# Patient Record
Sex: Female | Born: 1985
Health system: Southern US, Community
[De-identification: ages and names within clinical notes are randomized; demographics above are authoritative.]

## PROBLEM LIST (undated history)

## (undated) DIAGNOSIS — F329 Major depressive disorder, single episode, unspecified: Secondary | ICD-10-CM

## (undated) DIAGNOSIS — F419 Anxiety disorder, unspecified: Secondary | ICD-10-CM

## (undated) DIAGNOSIS — J189 Pneumonia, unspecified organism: Secondary | ICD-10-CM

## (undated) DIAGNOSIS — N6009 Solitary cyst of unspecified breast: Secondary | ICD-10-CM

## (undated) DIAGNOSIS — F32A Depression, unspecified: Secondary | ICD-10-CM

## (undated) HISTORY — DX: Anxiety disorder, unspecified: F41.9

## (undated) HISTORY — PX: HERNIA REPAIR: SHX51

## (undated) HISTORY — DX: Depression, unspecified: F32.A

## (undated) HISTORY — PX: BREAST LUMPECTOMY: SHX2

## (undated) HISTORY — PX: MASTECTOMY: SHX3

## (undated) HISTORY — DX: Major depressive disorder, single episode, unspecified: F32.9

---
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
## 2008-07-13 ENCOUNTER — Inpatient Hospital Stay (HOSPITAL_COMMUNITY): Admission: EM | Admit: 2008-07-13 | Discharge: 2008-07-16 | Payer: Self-pay | Admitting: Emergency Medicine

## 2008-07-13 ENCOUNTER — Ambulatory Visit: Payer: Self-pay | Admitting: Internal Medicine

## 2008-07-17 ENCOUNTER — Encounter: Payer: Self-pay | Admitting: Internal Medicine

## 2008-07-18 ENCOUNTER — Encounter (INDEPENDENT_AMBULATORY_CARE_PROVIDER_SITE_OTHER): Payer: Self-pay | Admitting: Internal Medicine

## 2009-05-11 ENCOUNTER — Ambulatory Visit: Payer: Self-pay | Admitting: Internal Medicine

## 2009-05-11 DIAGNOSIS — K5900 Constipation, unspecified: Secondary | ICD-10-CM | POA: Insufficient documentation

## 2009-05-14 LAB — CONVERTED CEMR LAB
BUN: 12 mg/dL (ref 6–23)
Creatinine, Ser: 0.8 mg/dL (ref 0.4–1.2)
GFR calc non Af Amer: 115.9 mL/min (ref >60)
TSH: 0.34 microintl units/mL — ABNORMAL LOW (ref 0.35–5.50)

## 2009-05-15 ENCOUNTER — Telehealth: Payer: Self-pay | Admitting: Internal Medicine

## 2009-06-20 ENCOUNTER — Telehealth (INDEPENDENT_AMBULATORY_CARE_PROVIDER_SITE_OTHER): Payer: Self-pay | Admitting: *Deleted

## 2009-06-25 ENCOUNTER — Ambulatory Visit: Payer: Self-pay | Admitting: Internal Medicine

## 2009-06-25 LAB — CONVERTED CEMR LAB
T3 Uptake Ratio: 39.3 % — ABNORMAL HIGH (ref 22.5–37.0)
T4, Total: 7.8 ug/dL (ref 5.0–12.5)

## 2009-06-26 ENCOUNTER — Ambulatory Visit: Payer: Self-pay | Admitting: Internal Medicine

## 2009-08-08 ENCOUNTER — Encounter (INDEPENDENT_AMBULATORY_CARE_PROVIDER_SITE_OTHER): Payer: Self-pay | Admitting: *Deleted

## 2010-05-01 ENCOUNTER — Telehealth: Payer: Self-pay | Admitting: Internal Medicine

## 2010-11-12 NOTE — Progress Notes (Signed)
Summary: Schedule Office Visit  Phone Note Outgoing Call Call back at [facility] Phone [phone]   Call placed by: Harlow Mares CMA Duncan Dull),  May 01, 2010 11:56 AM Call placed to: Patient Summary of Call: Left message on patients machine to call back. patient is due for an office visit. Initial call taken by: Harlow Mares CMA Duncan Dull),  May 01, 2010 11:57 AM  Follow-up for Phone Call        Left a message on the patient machine to call back and schedule a previsit and procedure with our office. A letter will be mailed to the patient.   Follow-up by: Harlow Mares CMA Duncan Dull),  May 09, 2010 9:55 AM

## 2011-02-28 NOTE — Discharge Summary (Signed)
[name], Gonzales               ACCOUNT NO.:  [id number]   MEDICAL RECORD NO.:  [id number]          PATIENT TYPE:  INP   LOCATION:  5524                         FACILITY:  [facility]   PHYSICIAN:  Manning Charity, MD     DATE OF BIRTH:  Nov 14, 1985   DATE OF ADMISSION:  07/13/2008  DATE OF DISCHARGE:  07/16/2008                               DISCHARGE SUMMARY   DISCHARGE DIAGNOSES:  1. Abdominal pain with nausea and vomiting, likely sec. to      constipation.  2. Gall bladded polyps (X2).  3. Right ovarian simple cyst.  4. Hypokalemia.  5. Hyperglycemia, HbA1C 5.8%.   DISCHARGE MEDICATIONS:  1. K-Dur 20 mEq 1 tablet p.o. daily x3 days.  2. MiraLax 17 g p.o. nightly p.r.n. constipation.  3. Simethicone 40 mg p.o. q.a.c. and nightly p.r.n. for flatus and      bloating.  4. Vicodin 5/500 one tablet p.o. q.4 h. p.r.n. pain.   DISPOSITION:  The patient was discharged home with instructions to  follow up at the Outpatient Clinic.  The patient was advised that a  clinic representative will call her to confirm an appointment time.  She  was also instructed to return to the emergency room if her severe pain  or if additional symptoms of nausea and vomiting returned if needed or  in the event she is unable to return to the clinic.   ADMITTING HISTORY AND PHYSICAL:  Erin Gonzales is a 25 year old African  American female with no significant past medical history.  She presented  to the [facility] ED complaining of 2 days of abdominal pain, nausea,  and vomiting x2.  She describes the pain that started in the upper area  of her abdomen that awoke her up from sleep, that became sharp at times  but was constantly cramping like gas pain.  She denied any radiation  of the pain and states that it started after eating a spicy dinner.  She  denied any alleviating factors and denied any aggravating factors;  however, she stated that it seemed to be worse after eating food.  She  denied any prior  symptoms.  Denied any recent NSAID or alcohol use or  recent travel or sick contacts.  She denied fever, chills, blood in her  stools, blood in her urine, painful urination, vaginal discharge,  unprotected sex, other vaginal symptoms, and use of an IUD.  She denied  use of any other medications except for the NuvaRing that she uses for  birth control.  She admitted to having 2 sexual partners recently,  however, was very adamant that she always uses condoms as well as other  methods of safe sex techniques in addition to her NuvaRing.  She denied  any prior history of STD.  Her last menstrual period was July 05, 2008.   PHYSICAL EXAMINATION:  GENERAL:  No apparent distress.  The patient was  talking on the phone while in the ER.  Alert and oriented x3.  HEAD AND NECK:  PERRLA, EOMI, sclerae were anicteric and conjunctivae  within normal limits.  Mucous  membranes were slightly dry; however, no  erythema was noted in the posterior oropharynx.  Neck was supple without  any palpable nodes or masses.  No carotid bruits were heard with  auscultation.  RESPIRATORY:  Lungs were clear to auscultation bilaterally with no  rhonchi, no wheezing, and no crackles.  CARDIOVASCULAR:  Clear S1, S2.  Regular rate and rhythm with no murmurs,  rubs, clicks, or gallops.  GASTROINTESTINAL:  Abdomen was soft.  Bowel sounds were present x4 and  hypoactive.  Diffuse tenderness was noted on palpation, however, was  clearest in the epigastric, right upper quadrant, and right lower  quadrant area with minimal guarding in the right upper and lower  quadrants.  No rebound.  Murphy sign negative.  Rovsing sign negative.  Psoas negative.  EXTREMITIES:  Within normal limits.  GU:  No CVA tenderness.  SKIN:  No jaundice, no rash visible, no lesions visible.  NEURO:  No focal deficit.   LABORATORY DATA:  Urine pregnancy test was negative.  Urinalysis showed  a cloudy urine with a pH of 8.5, glucose of 100, total  protein of 100,  nitrite negative, leukocyte esterase negative; however, many epithelial  cells were noticed in the sample along with minimal bacteria and a few  white blood cells.  The amount of epithelial cells indicated, but this  was not the ideal specimen for analysis.  Complete metabolic panel was  as follows:  Sodium 135, potassium 2.9, chloride 101, CO2 of 24, BUN 7,  creatinine 0.8, and glucose 162.  Anion gap 10, total bili 0.6, alk phos  60, AST 25, ALT 27, total protein 6.9, albumin 3.6, calcium 8.8.  CBC  was as follows:  White blood cells 10.1 with ANC of 7.2, hemoglobin 13,  hematocrit 39.4 with MCV of 81.8, platelets 345.  Acute abdominal series  was obtained showing no acute cardiopulmonary findings as well as  moderate stool throughout the colon with normal gas pattern suggestive  of constipation.  Abdominal ultrasound was also obtained indicating the  presence of 2 hyperechoic non-mobile polyps of 5.5 mm and 6 cm each.  There was no strong evidence for acute cholecystitis.   The patient was started on Zofran, Pepcid, Reglan, and given IV fluids  and IV potassium as well as medications for pain control and was  admitted to Internal Medicine Teaching Service onto the regular floor as  an inpatient.   HOSPITAL COURSE BY PROBLEMS:  1. Abdominal pain.  The patient complained of abdominal pain      particularly in the right upper and right lower quadrant.  Although      the abdominal ultrasound obtained in the ER indicated the presence      of gallbladder polyps, it was not felt that these polyps would be      the most likely cause of acute onset of abdominal pain with nausea      and vomiting; however, we will likely need continued followup with      repeat ultrasound on an outpatient basis.  The acute abdominal      series as well as the patient's complaints were highly suggestive      of abdominal pain secondary to constipation.  The patient was given      Senokot as  well as soap suds enema x2, both of which produced bowel      movements with decrease of abdominal pain per patient.  Despite      some mild improvement, she continued  to have some pain on July 14, 2008.  Transvaginal ultrasound was performed to rule out ovarian      torsion.  The procedure was within normal limits, however,      indicated the presence of a 2.3-cm simple cyst on the right ovary,      most likely representative of a dominant follicle.  This pretty      likely could have contributed to her lower quadrant pain, however,      would be expected to resolve with time.  Abdominal CT was performed      as well as the patient continued to complain of pain.  The CT      confirmed the presence of a right ovarian cyst as well as some      questionable gallbladder wall thickening; however, all labs were      within normal limits.  Repeat urinalysis and culture were negative.      Fecal occult blood were negative.  H. pylori was negative and urine      Chlamydia and gonorrhea screen was negative.  The patient was      reassured that her symptoms were most likely secondary to      constipation as well as possible ovarian cyst and that her symptoms      would resolve with time.  She was sent home with some MiraLax with      simethicone as well as Vicodin for pain control and relief of      constipation.  She will have a followup appointment at the Medicine      Outpatient Clinic at [facility] and was advised to return to the      emergency department if her symptoms returned or worsened.  2. Hypokalemia.  The patient's potassium on admission was 2.9.      Initially, she was repleted with IV potassium and subsequently      given oral potassium.  Her potassium levels stabilized, however,      remained at the lower limits of normal alternative course of her      hospital stay and on discharge, potassium is 3.3.  She was      discharged home with oral potassium to take over the course  of 3      days.  This will also be followed up as an outpatient and her visit      to the outpatient clinic.  3. Hyperglycemia.  The patient's glucose was elevated on admission at      a level of 162.  There was concern about potential diabetes      mellitus type 2 given strong family history and the patient      complaining of some polyuria and polydipsia.  HbA1c was ordered      with a result of 5.8.  We reassured the patient that she most      likely was not developing diabetes mellitus type 2 at this time,      and we felt that her hyperglycemia was transient secondary to her      acute illness.  Her blood glucose levels resolved and remained      within normal limits for the remainder of her hospital course.   VITAL SIGNS ON DISCHARGE:  Temperature 98.3, heart rate 87, respiratory  rate 20, blood pressure 80/55, and pulse ox of 99% on room air.   Basic metabolic panel was obtained with results as follows:  Sodium  139,  potassium 3.3, chloride 105, CO2 of 27, BUN 2, creatinine 0.66, and  glucose 84.  Magnesium was also obtained with a level of 1.8.   Hollace Hayward MS IV      Carlus Pavlov, M.D.  Electronically Signed      Manning Charity, MD  Electronically Signed    CG/MEDQ  D:  07/17/2008  T:  07/18/2008  Job:  [id number]

## 2011-07-15 LAB — BASIC METABOLIC PANEL
BUN: 2 — ABNORMAL LOW
BUN: 3 — ABNORMAL LOW
CO2: 24
Calcium: 8.5
Calcium: 8.8
Calcium: 9
Chloride: 105
Creatinine, Ser: 0.77
Creatinine, Ser: 0.79
Creatinine, Ser: 0.81
GFR calc Af Amer: >60
GFR calc Af Amer: >60
GFR calc non Af Amer: >60
GFR calc non Af Amer: >60
GFR calc non Af Amer: >60
Glucose, Bld: 105 — ABNORMAL HIGH
Glucose, Bld: 83
Glucose, Bld: 90
Potassium: 3.3 — ABNORMAL LOW
Sodium: 138
Sodium: 140

## 2011-07-15 LAB — URINALYSIS, ROUTINE W REFLEX MICROSCOPIC
Bilirubin Urine: NEGATIVE
Glucose, UA: NEGATIVE
Hgb urine dipstick: NEGATIVE
Ketones, ur: 15 — AB
Protein, ur: NEGATIVE
Specific Gravity, Urine: 1.02
Urobilinogen, UA: 1
pH: 5.5
pH: 8.5 — ABNORMAL HIGH

## 2011-07-15 LAB — DIFFERENTIAL
Lymphocytes Relative: 20
Monocytes Absolute: 0.8
Monocytes Relative: 8
Neutro Abs: 7.2

## 2011-07-15 LAB — COMPREHENSIVE METABOLIC PANEL
ALT: 27
AST: 25
Albumin: 2.8 — ABNORMAL LOW
Albumin: 3.6
BUN: 2 — ABNORMAL LOW
BUN: 7
CO2: 24
Calcium: 8.5
Calcium: 8.8
Chloride: 101
Creatinine, Ser: 0.77
Glucose, Bld: 162 — ABNORMAL HIGH
Glucose, Bld: 84
Total Bilirubin: 0.6
Total Protein: 6.2
Total Protein: 6.9

## 2011-07-15 LAB — GLUCOSE, CAPILLARY
Glucose-Capillary: 112 — ABNORMAL HIGH
Glucose-Capillary: 73
Glucose-Capillary: 74
Glucose-Capillary: 79
Glucose-Capillary: 80
Glucose-Capillary: 90

## 2011-07-15 LAB — CBC
HCT: 34.8 — ABNORMAL LOW
Hemoglobin: 11.8 — ABNORMAL LOW
Hemoglobin: 11.9 — ABNORMAL LOW
MCHC: 33.1
MCHC: 33.2
MCHC: 33.9
MCV: 80.2
Platelets: 252
Platelets: 294
Platelets: 345
RBC: 4.82
RDW: 14.1
RDW: 14.3

## 2011-07-15 LAB — URINE MICROSCOPIC-ADD ON

## 2011-07-15 LAB — LIPID PANEL
HDL: 51
LDL Cholesterol: 96
Triglycerides: 23
VLDL: 5

## 2011-07-15 LAB — MAGNESIUM: Magnesium: 1.8

## 2011-07-15 LAB — URINE CULTURE

## 2011-07-15 LAB — HELICOBACTER PYLORI ANTIGEN DET, STOOL: H pylori Ag, Stl: NEGATIVE

## 2011-07-15 LAB — HEMOGLOBIN A1C: Mean Plasma Glucose: 120

## 2012-01-30 ENCOUNTER — Ambulatory Visit (INDEPENDENT_AMBULATORY_CARE_PROVIDER_SITE_OTHER): Payer: BC Managed Care – HMO | Admitting: Obstetrics and Gynecology

## 2012-01-30 DIAGNOSIS — N912 Amenorrhea, unspecified: Secondary | ICD-10-CM

## 2012-01-30 LAB — POCT URINE PREGNANCY: Preg Test, Ur: NEGATIVE

## 2012-04-27 ENCOUNTER — Encounter: Payer: Self-pay | Admitting: Internal Medicine

## 2012-05-04 ENCOUNTER — Encounter: Payer: Self-pay | Admitting: Internal Medicine

## 2012-06-03 ENCOUNTER — Encounter: Payer: Self-pay | Admitting: Internal Medicine

## 2012-06-21 ENCOUNTER — Ambulatory Visit: Payer: Self-pay | Admitting: Internal Medicine

## 2013-01-06 ENCOUNTER — Other Ambulatory Visit: Payer: Self-pay | Admitting: Obstetrics and Gynecology

## 2013-01-07 LAB — PAP IG W/ RFLX HPV ASCU

## 2013-01-26 ENCOUNTER — Encounter: Payer: Self-pay | Admitting: Internal Medicine

## 2013-03-01 ENCOUNTER — Ambulatory Visit: Payer: Self-pay | Admitting: Internal Medicine

## 2014-11-01 ENCOUNTER — Ambulatory Visit (INDEPENDENT_AMBULATORY_CARE_PROVIDER_SITE_OTHER): Payer: BLUE CROSS/BLUE SHIELD | Admitting: Physician Assistant

## 2014-11-01 VITALS — BP 102/66 | HR 98 | Temp 98.3°F | Resp 16 | Ht 60.25 in | Wt 196.2 lb

## 2014-11-01 DIAGNOSIS — Z131 Encounter for screening for diabetes mellitus: Secondary | ICD-10-CM

## 2014-11-01 DIAGNOSIS — Z713 Dietary counseling and surveillance: Secondary | ICD-10-CM

## 2014-11-01 DIAGNOSIS — R635 Abnormal weight gain: Secondary | ICD-10-CM

## 2014-11-01 DIAGNOSIS — Z026 Encounter for examination for insurance purposes: Secondary | ICD-10-CM

## 2014-11-01 DIAGNOSIS — Z13220 Encounter for screening for lipoid disorders: Secondary | ICD-10-CM

## 2014-11-01 DIAGNOSIS — Z1329 Encounter for screening for other suspected endocrine disorder: Secondary | ICD-10-CM

## 2014-11-01 LAB — COMPREHENSIVE METABOLIC PANEL
ALT: 16 U/L (ref 0–35)
AST: 15 U/L (ref 0–37)
Albumin: 4.3 g/dL (ref 3.5–5.2)
Alkaline Phosphatase: 68 U/L (ref 39–117)
BUN: 9 mg/dL (ref 6–23)
CO2: 26 mEq/L (ref 19–32)
Calcium: 9.5 mg/dL (ref 8.4–10.5)
Chloride: 101 mEq/L (ref 96–112)
Creat: 0.83 mg/dL (ref 0.50–1.10)
Glucose, Bld: 92 mg/dL (ref 70–99)
Potassium: 3.7 mEq/L (ref 3.5–5.3)
Sodium: 137 mEq/L (ref 135–145)
Total Bilirubin: 0.4 mg/dL (ref 0.2–1.2)
Total Protein: 8.1 g/dL (ref 6.0–8.3)

## 2014-11-01 LAB — LIPID PANEL
Cholesterol: 172 mg/dL (ref 0–200)
HDL: 41 mg/dL (ref >39)
LDL CALC: 114 mg/dL — AB (ref 0–99)
Total CHOL/HDL Ratio: 4.2 Ratio
Triglycerides: 84 mg/dL (ref <150)
VLDL: 17 mg/dL (ref 0–40)

## 2014-11-01 NOTE — Patient Instructions (Addendum)
Thanks for coming in today.  We drew some different labs today and I'll be in contact with you with those results. I'd like to see you back in one month to discuss how things are going. In the meantime, please keep a record of all your calorie intake over the next 2 weeks. This will help to give you an idea of where you're at. I would then recommend trying to cut out about 200 calories per day. Coupling this with trying to burn about 200 calories per day through exercise is a great long term solution.  Please let us know if you need to have a complete physical here, we'd be happy to do it.

## 2014-11-01 NOTE — Progress Notes (Signed)
   Subjective:    Patient ID: Erin Gonzales, female    DOB: 04/04/1986, 29 y.o.   MMargart SicklesN: [id number]  PCP: Janine LimboSTRINGER,ARTHUR V, MD  Chief Complaint  Patient presents with  . Health Screening    Needs form completed for employer   . Weight Gain    Would like guidance on how to lose weight    Patient Active Problem List   Diagnosis Date Noted  . CONSTIPATION 05/11/2009   Prior to Admission medications   Medication Sig Start Date End Date Taking? Authorizing Provider  levonorgestrel (MIRENA) 20 MCG/24HR IUD 1 each by Intrauterine route once.   Yes Historical Provider, MD  Multiple Vitamin (MULTIVITAMIN) tablet Take 1 tablet by mouth daily.   Yes Historical Provider, MD   Medications, allergies, past medical history, surgical history, family history, social history and problem list reviewed and updated.  HPI  [age] yof presents needing form filled out for insurance through work.   Needs BMI, HR, BP documented along with lipids and glucose drawn.   She also is interested in weight loss counseling. She is currently 196# and BMI is 38. She states has always been 20# ish pounds overweight but over the past year has gained a lot more. States she has tried different diets with no luck.   Average meals: Bfast: Slimfast shakfe, apple, juice Lunch: 6" subway sandwich, chips, regular soda Dinner: Protein/vegetable/starch Snacks: None Drinks: Approx 32 oz juice day. Has been drinking lot more alcohol than normal for the past year. Drinks 3-x/week. Liquor usually mixed with juice/soda.   Denies depression/anxiety. Has been drinking more as she is dating an Tree surgeonartist and her social scene now involves more going out.   Exercise: None. Despises it.   Review of Systems No cp, palps, edema, sob, dysuria, hematuria, ha, vision changes.     Objective:   Physical Exam  Constitutional: She is oriented to person, place, and time.  Non-toxic appearance. She does not have a sickly appearance. She does not  appear ill. No distress.  BP 102/66 mmHg  Pulse 98  Temp(Src) 98.3 F (36.8 C) (Oral)  Resp 16  Ht 5' 0.25" (1.53 m)  Wt 196 lb 3.2 oz (88.996 kg)  BMI 38.02 kg/m2  SpO2 100%  LMP 10/28/2014   Neurological: She is alert and oriented to person, place, and time.  Psychiatric: She has a normal mood and affect. Her speech is normal. She expresses no suicidal plans.      Assessment & Plan:   [age] yof presents needing form filled out for insurance through work.   Screening for diabetes mellitus - Plan: Comprehensive metabolic panel Screening for thyroid disorder - Plan: TSH Weight gain Screening for hypercholesterolemia - Plan: Lipid panel  Dietary counseling --Long discussion on weight, diet options, etc --Pt to keep records of caloric intake, rtc to see me in one month for further discussion --Gave my schedule for end of feb, pt to pick a time and see me back  Donnajean Lopesodd M. Denis Koppel, PA-C Physician Assistant-Certified [facility]  11/01/2014 11:59 AM  Greater than 45 minutes spent with patient, greater than 50% of that time spent discussing diet and exercise.

## 2014-11-02 LAB — TSH: TSH: 1.317 u[IU]/mL (ref 0.350–4.500)

## 2015-01-25 ENCOUNTER — Telehealth: Payer: Self-pay | Admitting: Physician Assistant

## 2015-01-25 NOTE — Telephone Encounter (Signed)
Patient dropped off a physician results form to be completed for insurance purposes. Patient saw Deliah BostonMichael Clark in January 2016. Patient states the deadline is 01/29/2015 (Monday). Can this possibly be completed today? Form has been placed in [employer] Clark's box for review and signature. Clinical, please call patient when this is ready for pick up.  [phone]

## 2015-01-26 NOTE — Telephone Encounter (Signed)
Hi Tamara,  I have never seen this patient.  I looked back in epic and it looks like she saw Tawanna Coolerodd in January.  I will forward this message to him. I am happy to sign if he needs me to, but I will be out of the office for today and the next three days. Deliah BostonMichael Bralen Wiltgen, MS, PA-C   2:33 PM, 01/26/2015

## 2015-01-26 NOTE — Telephone Encounter (Signed)
Do not see form in my or Coralyn HellingMichael Clarks box at this time.

## 2015-01-27 NOTE — Telephone Encounter (Signed)
No I never saw it. I'll be back in the clinic on Monday morning.

## 2015-01-27 NOTE — Telephone Encounter (Signed)
Todd, have you seen this form?

## 2015-01-30 NOTE — Telephone Encounter (Signed)
Form located in Michael's box. Completed form, signed by Tawanna Coolerodd, faxed to employer and copied for patient pickup and scanning. Pt notified.

## 2015-01-30 NOTE — Telephone Encounter (Signed)
Called pt to see if she can fax me another form to fill out.

## 2015-08-13 ENCOUNTER — Ambulatory Visit (INDEPENDENT_AMBULATORY_CARE_PROVIDER_SITE_OTHER): Payer: BLUE CROSS/BLUE SHIELD | Admitting: Physician Assistant

## 2015-08-13 VITALS — BP 102/72 | HR 81 | Temp 98.2°F | Resp 16 | Ht 60.0 in | Wt 193.0 lb

## 2015-08-13 DIAGNOSIS — N949 Unspecified condition associated with female genital organs and menstrual cycle: Secondary | ICD-10-CM | POA: Diagnosis not present

## 2015-08-13 DIAGNOSIS — Z113 Encounter for screening for infections with a predominantly sexual mode of transmission: Secondary | ICD-10-CM | POA: Diagnosis not present

## 2015-08-13 LAB — HIV ANTIBODY (ROUTINE TESTING W REFLEX): HIV: NONREACTIVE

## 2015-08-13 LAB — RPR

## 2015-08-13 NOTE — Patient Instructions (Signed)
I will contact you with the results of your lab work as soon as possible.   You can use the warm compresses at least 3-4 times per day.  Do not make the warmth too hot. You can also take warm sitz baths, but these are asymptomatic.

## 2015-08-13 NOTE — Progress Notes (Signed)
[facility] [address], [place] [address] [phone]  Date:  08/13/2015   Name:  Erin Gonzales   DOB:  [birth year]   MRN:  [id number]  PCP:  Janine Limbo, MD    History of Present Illness:  Erin Gonzales is a 29 y.o. female patient who presents to [facility] for chief complaint of bumps she found at her right side of vagina yesterday.  She states that they are non-tender, and pimple like.  No pruritus, redness, or erythema.  She has a hx of boils.  She has no change in vaginal discharge, or urinary changes.  She is sexually active unprotected.  Recently came from a trip days ago, where she was swimming at the beach.  No hot baths or jacuzzi.         Patient Active Problem List   Diagnosis Date Noted  . CONSTIPATION 05/11/2009    Past Medical History  Diagnosis Date  . Anxiety   . Depression     History reviewed. No pertinent past surgical history.  Social History  Substance Use Topics  . Smoking status: Never Smoker   . Smokeless tobacco: None  . Alcohol Use: 3.0 - 3.6 oz/week    5-6 Standard drinks or equivalent per week    Family History  Problem Relation Age of Onset  . Hyperlipidemia Mother   . Diabetes Father   . Cancer Maternal Grandmother   . Cancer Paternal Grandmother   . Cancer Paternal Grandfather     Allergies  Allergen Reactions  . Peanut-Containing Drug Products     Medication list has been reviewed and updated.  Current Outpatient Prescriptions on File Prior to Visit  Medication Sig Dispense Refill  . levonorgestrel (MIRENA) 20 MCG/24HR IUD 1 each by Intrauterine route once.    . Multiple Vitamin (MULTIVITAMIN) tablet Take 1 tablet by mouth daily.     No current facility-administered medications on file prior to visit.    ROS ROS otherwise unremarkable unless listed above.   Physical Examination: BP 102/72 mmHg  Pulse 81  Temp(Src) 98.2 F (36.8 C) (Oral)  Resp 16  Ht 5' (1.524 m)  Wt 193 lb (87.544 kg)   BMI 37.69 kg/m2  SpO2 99%  LMP 08/08/2015 Ideal Body Weight: Weight in (lb) to have BMI = 25: 127.7  Physical Exam  Constitutional: She is oriented to person, place, and time. She appears well-developed and well-nourished. No distress.  HENT:  Head: Normocephalic and atraumatic.  Right Ear: External ear normal.  Left Ear: External ear normal.  Eyes: Conjunctivae and EOM are normal. Pupils are equal, round, and reactive to light.  Cardiovascular: Normal rate.   Pulmonary/Chest: Effort normal. No respiratory distress.  Genitourinary: Vagina normal.    Pelvic exam was performed with patient supine. Cervix exhibits no motion tenderness, no discharge and no friability. No vaginal discharge found.  Small non-erythematous cyst like lesions at the right labia minora.  Non-tender.  No drainage.    Neurological: She is alert and oriented to person, place, and time.  Skin: She is not diaphoretic.  Psychiatric: She has a normal mood and affect. Her behavior is normal.     Assessment and Plan: Erin Gonzales is a 29 y.o. female who is here today for lesions of the right lesion.  Advised warm compresses.  Possible folliculitis.  These are not herpetic, or syphilis-like lesions, but will test following sexual history. Advised warm compresses and monitoring. Screening for STD (sexually transmitted disease) -  Plan: HSV(herpes simplex vrs) 1+2 ab-IgG, HIV antibody, GC/Chlamydia Probe Amp, RPR  Genital lesion, female - Plan: HSV(herpes simplex vrs) 1+2 ab-IgG, RPR    Trena PlattStephanie Renna Kilmer, PA-C [facility] 08/13/2015 8:55 AM

## 2015-08-14 LAB — GC/CHLAMYDIA PROBE AMP
CT Probe RNA: NEGATIVE
GC Probe RNA: NEGATIVE

## 2015-08-14 LAB — HSV(HERPES SIMPLEX VRS) I + II AB-IGG: HSV 2 GLYCOPROTEIN G AB, IGG: 0.22 IV

## 2015-10-25 ENCOUNTER — Ambulatory Visit (INDEPENDENT_AMBULATORY_CARE_PROVIDER_SITE_OTHER): Payer: 59 | Admitting: Physician Assistant

## 2015-10-25 VITALS — BP 107/72 | HR 93 | Temp 98.1°F | Resp 18 | Ht 59.0 in | Wt 190.0 lb

## 2015-10-25 DIAGNOSIS — K148 Other diseases of tongue: Secondary | ICD-10-CM

## 2015-10-25 DIAGNOSIS — B370 Candidal stomatitis: Secondary | ICD-10-CM | POA: Diagnosis not present

## 2015-10-25 LAB — POCT GLYCOSYLATED HEMOGLOBIN (HGB A1C): Hemoglobin A1C: 5.5

## 2015-10-25 LAB — POCT SKIN KOH: Skin KOH, POC: POSITIVE

## 2015-10-25 MED ORDER — FLUCONAZOLE 100 MG PO TABS: 100.0000 mg | ORAL_TABLET | Freq: Every day | ORAL | Status: DC

## 2015-10-25 NOTE — Progress Notes (Signed)
10/25/2015 9:21 PM   DOB: 1986-04-04 / MRN: [id number]  SUBJECTIVE:  Erin Gonzales is a 30 y.o. female presenting for for the evaluation of sore throat that started 1 days ago.  Associated symptoms include cough today and she denies runny nose, congestion, fever, headache and jaw pain. Treatments tried thus far include OTC preparations with poor relief. She reports sick contacts. She has been pushing PO fluids.     She is allergic to peanut-containing drug products.   She  has a past medical history of Anxiety and Depression.    She  reports that she has never smoked. She does not have any smokeless tobacco history on file. She reports that she drinks about 3.0 - 3.6 oz of alcohol per week. She reports that she does not use illicit drugs. She  has no sexual activity history on file. The patient  has no past surgical history on file.  Her family history includes Cancer in her maternal grandmother, paternal grandfather, and paternal grandmother; Diabetes in her father; Hyperlipidemia in her mother.  Review of Systems  Constitutional: Negative for fever and chills.  HENT: Negative for congestion and ear pain.   Eyes: Negative for blurred vision.  Respiratory: Positive for cough. Negative for hemoptysis and shortness of breath.   Cardiovascular: Negative for chest pain.  Gastrointestinal: Negative for nausea and abdominal pain.  Genitourinary: Negative for dysuria, urgency and frequency.  Musculoskeletal: Negative for myalgias.  Skin: Negative for rash.  Neurological: Negative for dizziness, tingling and headaches.  Psychiatric/Behavioral: Negative for depression. The patient is not nervous/anxious.     Problem list and medications reviewed and updated by myself where necessary, and exist elsewhere in the encounter.   OBJECTIVE:  BP 107/72 mmHg  Pulse 93  Temp(Src) 98.1 F (36.7 C) (Oral)  Resp 18  Ht 4\' 11"  (1.499 m)  Wt 190 lb (86.183 kg)  BMI 38.35 kg/m2  SpO2 99%  Physical  Exam  Constitutional: She is oriented to person, place, and time. She appears well-developed.  HENT:  Mouth/Throat:    Eyes: EOM are normal. Pupils are equal, round, and reactive to light.  Cardiovascular: Normal rate.   Pulmonary/Chest: Effort normal.  Abdominal: She exhibits no distension.  Musculoskeletal: Normal range of motion.  Neurological: She is alert and oriented to person, place, and time. No cranial nerve deficit.  Skin: Skin is warm and dry. She is not diaphoretic.  Psychiatric: She has a normal mood and affect.  Vitals reviewed.   Results for orders placed or performed in visit on 10/25/15 (from the past 48 hour(s))  POCT Skin KOH     Status: None   Collection Time: 10/25/15  8:59 PM  Result Value Ref Range   Skin KOH, POC Positive   POCT glycosylated hemoglobin (Hb A1C)     Status: None   Collection Time: 10/25/15  9:10 PM  Result Value Ref Range   Hemoglobin A1C 5.5     ASSESSMENT AND PLAN  Erin Gonzales was seen today for sore throat, other and other.  Diagnoses and all orders for this visit:  Tongue lesion -     POCT Skin KOH -     POCT glycosylated hemoglobin (Hb A1C)  Oral yeast infection -     POCT glycosylated hemoglobin (Hb A1C)  Other orders -     fluconazole (DIFLUCAN) 100 MG tablet; Take 1 tablet (100 mg total) by mouth daily. Take two on day one and one daily thereafter for two weeks.  The patient was advised to call or return to clinic if she does not see an improvement in symptoms or to seek the care of the closest emergency department if she worsens with the above plan.   Deliah BostonMichael Clark, MHS, PA-C [facility] 10/25/2015 9:21 PM

## 2015-10-31 NOTE — Progress Notes (Signed)
  Medical screening examination/treatment/procedure(s) were performed by non-physician practitioner and as supervising physician I was immediately available for consultation/collaboration.     

## 2015-10-31 NOTE — Addendum Note (Signed)
Addended by: Carmelina Dane on: 10/31/2015 07:49 PM   Modules accepted: Kipp Brood

## 2016-02-20 ENCOUNTER — Ambulatory Visit (HOSPITAL_COMMUNITY)
Admission: EM | Admit: 2016-02-20 | Discharge: 2016-02-20 | Disposition: A | Discharge status: Home / Self Care | Payer: 59 | Attending: Family Medicine | Admitting: Family Medicine

## 2016-02-20 ENCOUNTER — Encounter (HOSPITAL_COMMUNITY): Payer: Self-pay | Admitting: Emergency Medicine

## 2016-02-20 DIAGNOSIS — J189 Pneumonia, unspecified organism: Secondary | ICD-10-CM

## 2016-02-20 MED ORDER — CEFTRIAXONE SODIUM 1 G IJ SOLR
1.0000 g | Freq: Once | INTRAMUSCULAR | Status: AC
  Administered 2016-02-20: 1 g via INTRAMUSCULAR

## 2016-02-20 MED ORDER — ALBUTEROL SULFATE HFA 108 (90 BASE) MCG/ACT IN AERS: 2.0000 | INHALATION_SPRAY | RESPIRATORY_TRACT | Status: DC | PRN

## 2016-02-20 MED ORDER — CEFTRIAXONE SODIUM 1 G IJ SOLR
INTRAMUSCULAR | Status: AC
Start: 2016-02-20 — End: 2016-02-20
  Filled 2016-02-20: qty 10

## 2016-02-20 MED ORDER — LIDOCAINE HCL (PF) 1 % IJ SOLN
INTRAMUSCULAR | Status: AC
Start: 2016-02-20 — End: 2016-02-20
  Filled 2016-02-20: qty 5

## 2016-02-20 MED ORDER — AZITHROMYCIN 250 MG PO TABS: 250.0000 mg | ORAL_TABLET | Freq: Every day | ORAL | Status: DC

## 2016-02-20 MED ORDER — BENZONATATE 100 MG PO CAPS: 100.0000 mg | ORAL_CAPSULE | Freq: Three times a day (TID) | ORAL | Status: DC | PRN

## 2016-02-20 NOTE — ED Provider Notes (Signed)
CSN: [id number]     Arrival date & time 02/20/16  1842 History   First MD Initiated Contact with Patient 02/20/16 1925     Chief Complaint  Patient presents with  . Generalized Body Aches  . Cough   (Consider location/radiation/quality/duration/timing/severity/associated sxs/prior Treatment) Patient is a 30 y.o. female presenting with cough. The history is provided by the patient. No language interpreter was used.  Cough Associated symptoms: chills and diaphoresis   Associated symptoms: no chest pain, no ear pain, no shortness of breath, no sore throat and no wheezing   Patient presents with complaint of worsening cough, body aches and chills which began on Monday May 8th and have worsened since then. Slept most of the day on 05/08, developed cough that has been so severe she has had post tussive emesis (yesterday).  Has had chills and sweats but has not checked her temperature. No shortness of breath.  No nausea, no abd pain.   She is a nonsmoker. Works as Armed forces training and education officer at [employer]  Several students who have been sick contacts.   NKDA.   Past Medical History  Diagnosis Date  . Anxiety   . Depression    History reviewed. No pertinent past surgical history. Family History  Problem Relation Age of Onset  . Hyperlipidemia Mother   . Diabetes Father   . Cancer Maternal Grandmother   . Cancer Paternal Grandmother   . Cancer Paternal Grandfather    Social History  Substance Use Topics  . Smoking status: Never Smoker   . Smokeless tobacco: None  . Alcohol Use: 3.0 - 3.6 oz/week    5-6 Standard drinks or equivalent per week   OB History    No data available     Review of Systems  Constitutional: Positive for chills, diaphoresis and fatigue. Negative for appetite change.  HENT: Negative for ear pain and sore throat.   Respiratory: Positive for cough. Negative for shortness of breath and wheezing.   Cardiovascular: Negative for chest pain.   Endocrine:       LMP on menses now. Has Mirena IUD in place.     Allergies  Peanut-containing drug products  Home Medications   Prior to Admission medications   Medication Sig Start Date End Date Taking? Authorizing Provider  azithromycin (ZITHROMAX) 250 MG tablet Take 1 tablet (250 mg total) by mouth daily. Take first 2 tablets together, then 1 every day until finished. 02/20/16   Barbaraann Barthel, MD  benzonatate (TESSALON) 100 MG capsule Take 1 capsule (100 mg total) by mouth 3 (three) times daily as needed for cough. 02/20/16   Barbaraann Barthel, MD  fluconazole (DIFLUCAN) 100 MG tablet Take 1 tablet (100 mg total) by mouth daily. Take two on day one and one daily thereafter for two weeks. 10/25/15   Ofilia Neas, PA-C  levonorgestrel (MIRENA) 20 MCG/24HR IUD 1 each by Intrauterine route once.    Historical Provider, MD   Meds Ordered and Administered this Visit   Medications  cefTRIAXone (ROCEPHIN) injection 1 g (not administered)    BP 96/68 mmHg  Pulse 111  Temp(Src) 98.6 F (37 C) (Oral)  Resp 12  SpO2 100%  LMP 02/20/2016 (Exact Date) No data found.   Physical Exam  Constitutional: She appears well-developed and well-nourished.  Mild distress, no increased work of breathing.  Speaking in fluid sentences .  HENT:  Head: Normocephalic and atraumatic.  Right Ear: External ear normal.  Left Ear: External ear normal.  Nose: Nose normal.  Mouth/Throat: Oropharynx is clear and moist. No oropharyngeal exudate.  Eyes: Conjunctivae and EOM are normal. Pupils are equal, round, and reactive to light. Right eye exhibits no discharge. Left eye exhibits no discharge.  Neck: Normal range of motion. Neck supple.  Shotty anterior cervical adenopathy  Cardiovascular: Normal rate, regular rhythm and normal heart sounds.   Pulmonary/Chest: Effort normal. No respiratory distress. She has no wheezes. She has rales. She exhibits no tenderness.  Fine rales heard at R base. No wheezes or  rhonchi  Abdominal: Soft. There is no tenderness.  Lymphadenopathy:    She has cervical adenopathy.  Skin: She is not diaphoretic.    ED Course  Procedures (including critical care time)  Labs Review Labs Reviewed - No data to display  Imaging Review No results found.   Visual Acuity Review  Right Eye Distance:   Left Eye Distance:   Bilateral Distance:    Right Eye Near:   Left Eye Near:    Bilateral Near:         MDM   1. Community acquired pneumonia    Patient with chills, cough, diaphoresis and fine rales R base on lung exam. Presumptive treatment for CAP.  Ceftriaxone 1g IM x1, azithro, plans to follow up as needed if not improving.   Paula ComptonJames Laqueshia Cihlar, MD    Barbaraann BarthelJames O Mahlon Gabrielle, MD 02/20/16 [id number]

## 2016-02-20 NOTE — ED Notes (Addendum)
[facility] with a complaint of a cough and general body aches x 3 days. The patient stated that she has tried OTC meds with minimal relief.

## 2016-02-20 NOTE — Discharge Instructions (Signed)
It is a pleasure to see you today.  You are being treated for a presumptive diagnosis of early pneumonia.   You received an injection of an antibiotic CEFTRIAXONE 1 gram intramuscularly.   You are brescribed AZITHROMYCIN 250mg  tablets, take 2 tablets by mouth today, then 1 tablet daily for 4 more days.   Tessalon perles by mouth every 8 hours as needed for cough.   Note for work; may return tomorrow if feeling much better.   Return to Urgent Care Center if worsening, or if not improving.

## 2016-02-23 ENCOUNTER — Ambulatory Visit (INDEPENDENT_AMBULATORY_CARE_PROVIDER_SITE_OTHER): Payer: 59

## 2016-02-23 ENCOUNTER — Ambulatory Visit (INDEPENDENT_AMBULATORY_CARE_PROVIDER_SITE_OTHER): Payer: 59 | Admitting: Internal Medicine

## 2016-02-23 VITALS — BP 108/70 | HR 108 | Temp 98.5°F | Resp 17 | Ht 59.0 in | Wt 186.0 lb

## 2016-02-23 DIAGNOSIS — J22 Unspecified acute lower respiratory infection: Secondary | ICD-10-CM

## 2016-02-23 DIAGNOSIS — J988 Other specified respiratory disorders: Secondary | ICD-10-CM

## 2016-02-23 DIAGNOSIS — R05 Cough: Secondary | ICD-10-CM

## 2016-02-23 DIAGNOSIS — R059 Cough, unspecified: Secondary | ICD-10-CM

## 2016-02-23 MED ORDER — PREDNISONE 20 MG PO TABS: ORAL_TABLET | ORAL | Status: DC

## 2016-02-23 MED ORDER — HYDROCODONE-HOMATROPINE 5-1.5 MG/5ML PO SYRP: 5.0000 mL | ORAL_SOLUTION | Freq: Four times a day (QID) | ORAL | Status: DC | PRN

## 2016-02-23 NOTE — Patient Instructions (Signed)
     IF you received an x-ray today, you will receive an invoice from [facility]. Please contact [facility] at [phone] with questions or concerns regarding your invoice.   IF you received labwork today, you will receive an invoice from [facility]. Please contact [facility] at [phone] with questions or concerns regarding your invoice.   Our billing staff will not be able to assist you with questions regarding bills from these companies.  You will be contacted with the lab results as soon as they are available. The fastest way to get your results is to activate your My Chart account. Instructions are located on the last page of this paperwork. If you have not heard from us regarding the results in 2 weeks, please contact this office.      

## 2016-02-23 NOTE — Progress Notes (Addendum)
By signing my name below I, [name], attest that this documentation has been prepared under the direction and in the presence of Ellamae Siaobert Effa Yarrow, MD. Electonically Signed. [name], Scribe 02/23/2016 at 11:28 AM   Subjective:    Patient ID: Erin Gonzales, female    DOB: 08/08/1986, 30 y.o.   MRN: [id number]  Chief Complaint  Patient presents with  . patient states she has pneumonia  . Cough  . chest pressure    HPI Erin SicklesSacha Lobb is a 30 y.o. female who presents to the [facility] complaining of cough for the past 6 days. Pt states she was seen and evaluated at another [facility] 3 days ago and was told she has pneumonia and was prescribed zithromax. Pt states her chills improved and her cough has persisted. Pt denies sore throat. Pt also reports general body aches, CP with cough and chest pressure. Pt states she did not have an xray taken at the urgent care.  Pt denies history of asthma.   Patient Active Problem List   Diagnosis Date Noted  . CONSTIPATION 05/11/2009    Current outpatient prescriptions:  .  albuterol (PROVENTIL HFA;VENTOLIN HFA) 108 (90 Base) MCG/ACT inhaler, Inhale 2 puffs into the lungs every 4 (four) hours as needed for wheezing or shortness of breath., Disp: 1 Inhaler, Rfl: 0 .  azithromycin (ZITHROMAX) 250 MG tablet, Take 1 tablet (250 mg total) by mouth daily. Take first 2 tablets together, then 1 every day until finished., Disp: 6 tablet, Rfl: 0 .  benzonatate (TESSALON) 100 MG capsule, Take 1 capsule (100 mg total) by mouth 3 (three) times daily as needed for cough., Disp: 21 capsule, Rfl: 0 .  fluconazole (DIFLUCAN) 100 MG tablet, Take 1 tablet (100 mg total) by mouth daily. Take two on day one and one daily thereafter for two weeks., Disp: 15 tablet, Rfl: 0 .  levonorgestrel (MIRENA) 20 MCG/24HR IUD, 1 each by Intrauterine route once., Disp: , Rfl:   Allergies  Allergen Reactions  . Peanut-Containing Drug Products        Review of Systems  Constitutional: Negative for fever.  Respiratory: Positive for cough.   Cardiovascular: Positive for chest pain.  Musculoskeletal: Positive for myalgias.       Objective:   Physical Exam  Constitutional: She is oriented to person, place, and time. She appears well-developed and well-nourished. No distress.  HENT:  Head: Normocephalic and atraumatic.  Eyes: Conjunctivae are normal. Pupils are equal, round, and reactive to light.  Neck: Neck supple.  Cardiovascular: Regular rhythm and normal heart sounds.  Tachycardia present.  Exam reveals no gallop.   No murmur heard. Pulmonary/Chest: Effort normal. She has decreased breath sounds (posteriorly). She has rhonchi (anteriorly, rt).  Musculoskeletal: Normal range of motion.  Neurological: She is alert and oriented to person, place, and time.  Skin: Skin is warm and dry.  Psychiatric: She has a normal mood and affect. Her behavior is normal.  Nursing note and vitals reviewed.   Filed Vitals:   02/23/16 0927  BP: 108/70  Pulse: 108  Temp: 98.5 F (36.9 C)  TempSrc: Oral  Resp: 17  Height: 4\' 11"  (1.499 m)  Weight: 186 lb (84.369 kg)  SpO2: 100%   Dg Chest 2 View  02/23/2016  CLINICAL DATA:  Cough and chills EXAM: CHEST  2 VIEW COMPARISON:  None. FINDINGS: Cardiomediastinal silhouette is stable. No pulmonary edema. Subtle streaky right upper lobe and left base retrocardiac atelectasis or early infiltrate.  Bony thorax is unremarkable. IMPRESSION: No pulmonary edema. Subtle streaky right upper and left base retrocardiac lobe atelectasis or early infiltrate. Electronically Signed   By: Natasha Mead M.D.   On: 02/23/2016 11:23         Assessment & Plan:  Cough - Plan: DG Chest 2 View due to part rx [facility] w/ RAD  Finish meds Meds ordered this encounter  Medications  . HYDROcodone-homatropine (HYCODAN) 5-1.5 MG/5ML syrup    Sig: Take 5 mLs by mouth every 6 (six) hours as needed.    Dispense:  120 mL     Refill:  0  . predniSONE (DELTASONE) 20 MG tablet    Sig: 3/3/2/2/1/1 single daily dose for 6 days    Dispense:  12 tablet    Refill:  0     I have completed the patient encounter in its entirety as documented by the scribe, with editing by me where necessary. Merrin Mcvicker P. Merla Riches, M.D.

## 2016-05-05 ENCOUNTER — Other Ambulatory Visit: Payer: Self-pay | Admitting: Family

## 2016-05-05 DIAGNOSIS — N6320 Unspecified lump in the left breast, unspecified quadrant: Secondary | ICD-10-CM

## 2016-05-05 DIAGNOSIS — N6325 Unspecified lump in the left breast, overlapping quadrants: Secondary | ICD-10-CM

## 2016-05-07 ENCOUNTER — Other Ambulatory Visit: Payer: Self-pay | Admitting: Family

## 2016-05-07 ENCOUNTER — Ambulatory Visit
Admission: RE | Admit: 2016-05-07 | Discharge: 2016-05-07 | Disposition: A | Discharge status: Home / Self Care | Payer: 59 | Source: Ambulatory Visit | Attending: Family | Admitting: Family

## 2016-05-07 DIAGNOSIS — N6325 Unspecified lump in the left breast, overlapping quadrants: Secondary | ICD-10-CM

## 2016-05-07 DIAGNOSIS — N6320 Unspecified lump in the left breast, unspecified quadrant: Secondary | ICD-10-CM

## 2016-05-08 ENCOUNTER — Other Ambulatory Visit: Payer: Self-pay | Admitting: Family

## 2016-05-08 DIAGNOSIS — N63 Unspecified lump in unspecified breast: Secondary | ICD-10-CM

## 2016-05-14 ENCOUNTER — Other Ambulatory Visit: Payer: Self-pay

## 2016-05-14 ENCOUNTER — Other Ambulatory Visit: Payer: Self-pay | Admitting: Family

## 2016-05-14 DIAGNOSIS — N63 Unspecified lump in unspecified breast: Secondary | ICD-10-CM

## 2016-05-15 ENCOUNTER — Ambulatory Visit
Admission: RE | Admit: 2016-05-15 | Discharge: 2016-05-15 | Disposition: A | Discharge status: Home / Self Care | Payer: 59 | Source: Ambulatory Visit | Attending: Family | Admitting: Family

## 2016-05-15 DIAGNOSIS — N63 Unspecified lump in unspecified breast: Secondary | ICD-10-CM

## 2016-05-29 ENCOUNTER — Other Ambulatory Visit: Payer: Self-pay | Admitting: Surgery

## 2016-06-10 ENCOUNTER — Encounter (HOSPITAL_COMMUNITY)
Admission: RE | Admit: 2016-06-10 | Discharge: 2016-06-10 | Disposition: A | Discharge status: Home / Self Care | Payer: 59 | Source: Ambulatory Visit | Attending: Surgery | Admitting: Surgery

## 2016-06-10 ENCOUNTER — Encounter (HOSPITAL_COMMUNITY): Payer: Self-pay

## 2016-06-10 ENCOUNTER — Ambulatory Visit (HOSPITAL_COMMUNITY)
Admission: RE | Admit: 2016-06-10 | Discharge: 2016-06-10 | Disposition: A | Discharge status: Home / Self Care | Payer: 59 | Source: Ambulatory Visit | Attending: Anesthesiology | Admitting: Anesthesiology

## 2016-06-10 ENCOUNTER — Encounter (INDEPENDENT_AMBULATORY_CARE_PROVIDER_SITE_OTHER): Payer: Self-pay

## 2016-06-10 DIAGNOSIS — Z01818 Encounter for other preprocedural examination: Secondary | ICD-10-CM | POA: Diagnosis not present

## 2016-06-10 DIAGNOSIS — Z3202 Encounter for pregnancy test, result negative: Secondary | ICD-10-CM | POA: Insufficient documentation

## 2016-06-10 HISTORY — DX: Pneumonia, unspecified organism: J18.9

## 2016-06-10 LAB — HCG, SERUM, QUALITATIVE: PREG SERUM: NEGATIVE

## 2016-06-10 LAB — CBC
HEMATOCRIT: 36.5 % (ref 36.0–46.0)
Hemoglobin: 11.6 g/dL — ABNORMAL LOW (ref 12.0–15.0)
MCH: 24.2 pg — ABNORMAL LOW (ref 26.0–34.0)
MCHC: 31.8 g/dL (ref 30.0–36.0)
MCV: 76.2 fL — ABNORMAL LOW (ref 78.0–100.0)
Platelets: 383 10*3/uL (ref 150–400)
RBC: 4.79 MIL/uL (ref 3.87–5.11)
RDW: 18.1 % — AB (ref 11.5–15.5)
WBC: 4.1 10*3/uL (ref 4.0–10.5)

## 2016-06-10 NOTE — H&P (Signed)
Erin Gonzales 05/29/2016 2:02 PM Location: [facility] Patient #: [id number] DOB: May 29, 1986 Single / Language: [place] / Race: Black or African American Female   History of Present Illness (Erin Gonzales A. Magnus Ivan MD; 05/29/2016 2:25 PM) The patient is a 30 year old female who presents with a breast abscess. This is a pleasant female referred to Dr. 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 She reports that the masses in the left breast appeared approximate 3 months ago. She has had drainage from her nipple which been both clear and bloody as well as purulent. She underwent ultrasound and mammogram. This showed multiple dilated ducts and 2 masses. Biopsies were performed and the final results showed chronic inflammation of both areas consistent with chronic infection. These areas have continued to persist and she now has purulent drainage from one of the biopsy sites. She is otherwise healthy without complaints. There is no family history of breast cancer.   Other Problems Fay Records, CMA; 05/29/2016 2:02 PM) Anxiety Disorder Depression Lump In Breast Umbilical Hernia Repair  Past Surgical History Fay Records, CMA; 05/29/2016 2:02 PM) Breast Biopsy Left.  Diagnostic Studies History Fay Records, [place]; 05/29/2016 2:02 PM) Colonoscopy never Mammogram never Pap Smear 1-5 years ago  Allergies Fay Records, CMA; 05/29/2016 2:03 PM) No Known Drug Allergies08/17/2017  Medication History Fay Records, CMA; 05/29/2016 2:03 PM) No Current Medications Medications Reconciled  Social History Fay Records, CMA; 05/29/2016 2:02 PM) Alcohol use Occasional alcohol use. Caffeine use Carbonated beverages, Coffee, Tea. Illicit drug use Prefer to discuss with provider.  Pregnancy / Birth History Fay Records, CMA; 05/29/2016 2:02 PM) Age at menarche 13 years. Contraceptive History Intrauterine device. Gravida 1 Irregular periods Maternal age 46-25 Para  0    Review of Systems Fay Records CMA; 05/29/2016 2:02 PM) General Not Present- Appetite Loss, Chills, Fatigue, Fever, Night Sweats, Weight Gain and Weight Loss. Skin Not Present- Change in Wart/Mole, Dryness, Hives, Jaundice, New Lesions, Non-Healing Wounds, Rash and Ulcer. HEENT Present- Wears glasses/contact lenses. Not Present- Earache, Hearing Loss, Hoarseness, Nose Bleed, Oral Ulcers, Ringing in the Ears, Seasonal Allergies, Sinus Pain, Sore Throat, Visual Disturbances and Yellow Eyes. Respiratory Not Present- Bloody sputum, Chronic Cough, Difficulty Breathing, Snoring and Wheezing. Breast Present- Breast Mass, Breast Pain, Nipple Discharge and Skin Changes. Cardiovascular Not Present- Chest Pain, Difficulty Breathing Lying Down, Leg Cramps, Palpitations, Rapid Heart Rate, Shortness of Breath and Swelling of Extremities. Gastrointestinal Not Present- Abdominal Pain, Bloating, Bloody Stool, Change in Bowel Habits, Chronic diarrhea, Constipation, Difficulty Swallowing, Excessive gas, Gets full quickly at meals, Hemorrhoids, Indigestion, Nausea, Rectal Pain and Vomiting. Female Genitourinary Not Present- Frequency, Nocturia, Painful Urination, Pelvic Pain and Urgency. Musculoskeletal Not Present- Back Pain, Joint Pain, Joint Stiffness, Muscle Pain, Muscle Weakness and Swelling of Extremities. Neurological Not Present- Decreased Memory, Fainting, Headaches, Numbness, Seizures, Tingling, Tremor, Trouble walking and Weakness. Psychiatric Present- Anxiety and Depression. Not Present- Bipolar, Change in Sleep Pattern, Fearful and Frequent crying. Endocrine Not Present- Cold Intolerance, Excessive Hunger, Hair Changes, Heat Intolerance, Hot flashes and New Diabetes. Hematology Not Present- Blood Thinners, Easy Bruising, Excessive bleeding, Gland problems, HIV and Persistent Infections.  Vitals Fay Records CMA; 05/29/2016 2:03 PM) 05/29/2016 2:03 PM Weight: 183 lb Height: 60in Body Surface  Area: 1.8 m Body Mass Index: 35.74 kg/m  Temp.: 98.101F(Temporal)  Pulse: 100 (Regular)  BP: 126/80 (Sitting, Left Arm, Standard)       Physical Exam (Braden Deloach A. Magnus Ivan MD; 05/29/2016 2:27 PM) General Mental Status-Alert. General Appearance-Consistent with  stated age. Hydration-Well hydrated. Voice-Normal.  Head and Neck Head-normocephalic, atraumatic with no lesions or palpable masses. Trachea-midline. Thyroid Gland Characteristics - normal size and consistency.  Eye Eyeball - Bilateral-Extraocular movements intact. Sclera/Conjunctiva - Bilateral-No scleral icterus.  Chest and Lung Exam Chest and lung exam reveals -quiet, even and easy respiratory effort with no use of accessory muscles and on auscultation, normal breath sounds, no adventitious sounds and normal vocal resonance. Inspection Chest Wall - Normal. Back - normal.  Breast Breast - Left-Symmetric, Mildly tender and Biopsy scar, No Dimpling, No Inflammation, No Lumpectomy scars, No Mastectomy scars, No Peau d' Orange. Note: Adjacent to the areola at the 7 o'clock position there is a small opening draining some purulent fluid. There is no erythema. There are several firm masses right in this area approximately 2-3 cm in size Breast - Right-Symmetric, Non Tender, No Biopsy scars, no Dimpling, No Inflammation, No Lumpectomy scars, No Mastectomy scars, No Peau d' Orange.  Cardiovascular Cardiovascular examination reveals -normal heart sounds, regular rate and rhythm with no murmurs and normal pedal pulses bilaterally.  Abdomen - Did not examine.  Neurologic Neurologic evaluation reveals -alert and oriented x 3 with no impairment of recent or remote memory. Mental Status-Normal.  Musculoskeletal - Did not examine.  Lymphatic - Did not examine.    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 Surgical excision of this area is recommended also for histologic evaluation to rule out malignancy. I discussed this with her in detail. I discussed the risks of surgery which includes but is not limited to bleeding, infection, recurrence, having a chronic open wound, need for further surgery, etc. She understands and wished to proceed with surgery. I will send a prescription of antibiotics the pharmacy in case this flares up significantly between now and surgical excision Current Plans Started Doxycycline Hyclate 100MG , 1 (one) Tablet two times daily, #20, 05/29/2016, No Refill.   S

## 2016-06-10 NOTE — Patient Instructions (Addendum)
Erin SicklesSacha Gonzales  06/10/2016   Your procedure is scheduled on: 06/11/2016    [facility]  [facility] to 3rd floor to  Short Stay Center at    1030 AM.  Call this number if you have problems the morning of surgery [phone]   Remember: ONLY 1 PERSON MAY GO WITH YOU TO SHORT STAY TO GET  READY MORNING OF YOUR SURGERY.  Do not eat food or drink liquids :After Midnight.     Take these medicines the morning of surgery with A SIP OF WATER:none                                  You may not have any metal on your body including hair pins and              piercings  Do not wear jewelry, make-up, lotions, powders or perfumes, deodorant             Do not wear nail polish.  Do not shave  48 hours prior to surgery.               Do not bring valuables to the hospital. [facility] IS NOT             RESPONSIBLE   FOR VALUABLES.  Contacts, dentures or bridgework may not be worn into surgery. .     Patients discharged the day of surgery will not be allowed to drive home.  Name and phone number of your driver:  Special Instructions: coughing and deep breathing exercises, leg exercises               Please read over the following fact sheets you were given: _____________________________________________________________________             [facility] - Preparing for [facility] Before surgery, you can play an important role.  Because skin is not sterile, your skin needs to be as free of germs as possible.  You can reduce the number of germs on your skin by washing with CHG (chlorahexidine gluconate) soap before surgery.  CHG is an antiseptic cleaner which kills germs and bonds with the skin to continue killing germs even after washing. Please DO NOT use if you have an allergy to CHG or antibacterial soaps.  If your skin becomes reddened/irritated stop using the CHG and inform your nurse when you arrive at Short Stay. Do not shave (including  legs and underarms) for at least 48 hours prior to the first CHG shower.  You may shave your face/neck. Please follow these instructions carefully:  1.  Shower with CHG Soap the night before surgery and the  morning of Surgery.  2.  If you choose to wash your hair, wash your hair first as usual with your  normal  shampoo.  3.  After you shampoo, rinse your hair and body thoroughly to remove the  shampoo.                           4.  Use CHG as you would any other liquid soap.  You can apply chg directly  to the skin and wash  Gently with a scrungie or clean washcloth.  5.  Apply the CHG Soap to your body ONLY FROM THE NECK DOWN.   Do not use on face/ open                           Wound or open sores. Avoid contact with eyes, ears mouth and genitals (private parts).                       Wash face,  Genitals (private parts) with your normal soap.             6.  Wash thoroughly, paying special attention to the area where your surgery  will be performed.  7.  Thoroughly rinse your body with warm water from the neck down.  8.  DO NOT shower/wash with your normal soap after using and rinsing off  the CHG Soap.                9.  Pat yourself dry with a clean towel.            10.  Wear clean pajamas.            11.  Place clean sheets on your bed the night of your first shower and do not  sleep with pets. Day of Surgery : Do not apply any lotions/deodorants the morning of surgery.  Please wear clean clothes to the hospital/surgery center.  FAILURE TO FOLLOW THESE INSTRUCTIONS MAY RESULT IN THE CANCELLATION OF YOUR SURGERY PATIENT SIGNATURE_________________________________  NURSE SIGNATURE__________________________________  ________________________________________________________________________

## 2016-06-11 ENCOUNTER — Ambulatory Visit (HOSPITAL_COMMUNITY): Admission: RE | Admit: 2016-06-11 | Payer: 59 | Source: Ambulatory Visit | Admitting: Surgery

## 2016-06-11 ENCOUNTER — Encounter (HOSPITAL_COMMUNITY): Admission: RE | Payer: Self-pay | Source: Ambulatory Visit

## 2016-06-11 SURGERY — BREAST LUMPECTOMY
Anesthesia: General | Site: Breast | Laterality: Left

## 2016-06-11 NOTE — Progress Notes (Signed)
Left voice mail message for patient with new date and time of surgery on 06/18/2016.  Arrive at 12 noond and srugery time from 200pm-300pm. Patient may have clear liquds from 12 midnite until 0730am and left patietn a message regarding above and asked her to call back to verify she received message.

## 2016-06-12 NOTE — Progress Notes (Signed)
Pt returned call back/pt verbalized understanding in regards to surgical date and time of arrival left on VM on 06/11/2016. Also verbalized understanding in regards to instuction left on previous VM with clear liquid diet.

## 2016-06-17 NOTE — H&P (Signed)
Erin Gonzales 05/29/2016 2:02 PM Location: [facility] Patient #: [id number] DOB: May 29, 1986 Single / Language: [place] / Race: Black or African American Female   History of Present Illness (Erin Staron A. Magnus Ivan MD; 05/29/2016 2:25 PM) The patient is a 30 year old female who presents with a breast abscess. This is a pleasant female referred to Dr. 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 She reports that the masses in the left breast appeared approximate 3 months ago. She has had drainage from her nipple which been both clear and bloody as well as purulent. She underwent ultrasound and mammogram. This showed multiple dilated ducts and 2 masses. Biopsies were performed and the final results showed chronic inflammation of both areas consistent with chronic infection. These areas have continued to persist and she now has purulent drainage from one of the biopsy sites. She is otherwise healthy without complaints. There is no family history of breast cancer.   Other Problems Fay Records, CMA; 05/29/2016 2:02 PM) Anxiety Disorder Depression Lump In Breast Umbilical Hernia Repair  Past Surgical History Fay Records, CMA; 05/29/2016 2:02 PM) Breast Biopsy Left.  Diagnostic Studies History Fay Records, [place]; 05/29/2016 2:02 PM) Colonoscopy never Mammogram never Pap Smear 1-5 years ago  Allergies Fay Records, CMA; 05/29/2016 2:03 PM) No Known Drug Allergies08/17/2017  Medication History Fay Records, CMA; 05/29/2016 2:03 PM) No Current Medications Medications Reconciled  Social History Fay Records, CMA; 05/29/2016 2:02 PM) Alcohol use Occasional alcohol use. Caffeine use Carbonated beverages, Coffee, Tea. Illicit drug use Prefer to discuss with provider.  Pregnancy / Birth History Fay Records, CMA; 05/29/2016 2:02 PM) Age at menarche 13 years. Contraceptive History Intrauterine device. Gravida 1 Irregular periods Maternal age 46-25 Para  0    Review of Systems Fay Records CMA; 05/29/2016 2:02 PM) General Not Present- Appetite Loss, Chills, Fatigue, Fever, Night Sweats, Weight Gain and Weight Loss. Skin Not Present- Change in Wart/Mole, Dryness, Hives, Jaundice, New Lesions, Non-Healing Wounds, Rash and Ulcer. HEENT Present- Wears glasses/contact lenses. Not Present- Earache, Hearing Loss, Hoarseness, Nose Bleed, Oral Ulcers, Ringing in the Ears, Seasonal Allergies, Sinus Pain, Sore Throat, Visual Disturbances and Yellow Eyes. Respiratory Not Present- Bloody sputum, Chronic Cough, Difficulty Breathing, Snoring and Wheezing. Breast Present- Breast Mass, Breast Pain, Nipple Discharge and Skin Changes. Cardiovascular Not Present- Chest Pain, Difficulty Breathing Lying Down, Leg Cramps, Palpitations, Rapid Heart Rate, Shortness of Breath and Swelling of Extremities. Gastrointestinal Not Present- Abdominal Pain, Bloating, Bloody Stool, Change in Bowel Habits, Chronic diarrhea, Constipation, Difficulty Swallowing, Excessive gas, Gets full quickly at meals, Hemorrhoids, Indigestion, Nausea, Rectal Pain and Vomiting. Female Genitourinary Not Present- Frequency, Nocturia, Painful Urination, Pelvic Pain and Urgency. Musculoskeletal Not Present- Back Pain, Joint Pain, Joint Stiffness, Muscle Pain, Muscle Weakness and Swelling of Extremities. Neurological Not Present- Decreased Memory, Fainting, Headaches, Numbness, Seizures, Tingling, Tremor, Trouble walking and Weakness. Psychiatric Present- Anxiety and Depression. Not Present- Bipolar, Change in Sleep Pattern, Fearful and Frequent crying. Endocrine Not Present- Cold Intolerance, Excessive Hunger, Hair Changes, Heat Intolerance, Hot flashes and New Diabetes. Hematology Not Present- Blood Thinners, Easy Bruising, Excessive bleeding, Gland problems, HIV and Persistent Infections.  Vitals Fay Records CMA; 05/29/2016 2:03 PM) 05/29/2016 2:03 PM Weight: 183 lb Height: 60in Body Surface  Area: 1.8 m Body Mass Index: 35.74 kg/m  Temp.: 98.101F(Temporal)  Pulse: 100 (Regular)  BP: 126/80 (Sitting, Left Arm, Standard)       Physical Exam (Taiga Lupinacci A. Magnus Ivan MD; 05/29/2016 2:27 PM) General Mental Status-Alert. General Appearance-Consistent with  stated age. Hydration-Well hydrated. Voice-Normal.  Head and Neck Head-normocephalic, atraumatic with no lesions or palpable masses. Trachea-midline. Thyroid Gland Characteristics - normal size and consistency.  Eye Eyeball - Bilateral-Extraocular movements intact. Sclera/Conjunctiva - Bilateral-No scleral icterus.  Chest and Lung Exam Chest and lung exam reveals -quiet, even and easy respiratory effort with no use of accessory muscles and on auscultation, normal breath sounds, no adventitious sounds and normal vocal resonance. Inspection Chest Wall - Normal. Back - normal.  Breast Breast - Left-Symmetric, Mildly tender and Biopsy scar, No Dimpling, No Inflammation, No Lumpectomy scars, No Mastectomy scars, No Peau d' Orange. Note: Adjacent to the areola at the 7 o'clock position there is a small opening draining some purulent fluid. There is no erythema. There are several firm masses right in this area approximately 2-3 cm in size Breast - Right-Symmetric, Non Tender, No Biopsy scars, no Dimpling, No Inflammation, No Lumpectomy scars, No Mastectomy scars, No Peau d' Orange.  Cardiovascular Cardiovascular examination reveals -normal heart sounds, regular rate and rhythm with no murmurs and normal pedal pulses bilaterally.  Abdomen - Did not examine.  Neurologic Neurologic evaluation reveals -alert and oriented x 3 with no impairment of recent or remote memory. Mental Status-Normal.  Musculoskeletal - Did not examine.  Lymphatic - Did not examine.    Assessment & Plan (Adisynn Suleiman A. 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 Surgical excision of this area is recommended also for histologic evaluation to rule out malignancy. I discussed this with her in detail. I discussed the risks of surgery which includes but is not limited to bleeding, infection, recurrence, having a chronic open wound, need for further surgery, etc. She understands and wished to proceed with surgery. I will send a prescription of antibiotics the pharmacy in case this flares up significantly between now and surgical excision Current Plans Started Doxycycline Hyclate 100MG , 1 (one) Tablet two times daily, #20, 05/29/2016, No Refill.

## 2016-06-18 ENCOUNTER — Encounter (HOSPITAL_COMMUNITY): Payer: Self-pay | Admitting: *Deleted

## 2016-06-18 ENCOUNTER — Encounter (HOSPITAL_COMMUNITY)
Admission: RE | Disposition: A | Discharge status: Home / Self Care | Payer: Self-pay | Source: Ambulatory Visit | Attending: Surgery

## 2016-06-18 ENCOUNTER — Ambulatory Visit (HOSPITAL_COMMUNITY): Payer: 59 | Admitting: Anesthesiology

## 2016-06-18 ENCOUNTER — Ambulatory Visit (HOSPITAL_COMMUNITY)
Admission: RE | Admit: 2016-06-18 | Discharge: 2016-06-18 | Disposition: A | Discharge status: Home / Self Care | Payer: 59 | Source: Ambulatory Visit | Attending: Surgery | Admitting: Surgery

## 2016-06-18 DIAGNOSIS — E669 Obesity, unspecified: Secondary | ICD-10-CM | POA: Diagnosis not present

## 2016-06-18 DIAGNOSIS — Z6837 Body mass index (BMI) 37.0-37.9, adult: Secondary | ICD-10-CM | POA: Diagnosis not present

## 2016-06-18 DIAGNOSIS — N611 Abscess of the breast and nipple: Secondary | ICD-10-CM | POA: Diagnosis not present

## 2016-06-18 HISTORY — PX: BREAST LUMPECTOMY: SHX2

## 2016-06-18 SURGERY — BREAST LUMPECTOMY
Anesthesia: General | Site: Breast | Laterality: Left

## 2016-06-18 MED ORDER — LIDOCAINE 2% (20 MG/ML) 5 ML SYRINGE
INTRAMUSCULAR | Status: AC
  Filled 2016-06-18: qty 5

## 2016-06-18 MED ORDER — ONDANSETRON HCL 4 MG/2ML IJ SOLN
INTRAMUSCULAR | Status: AC
  Filled 2016-06-18: qty 2

## 2016-06-18 MED ORDER — OXYCODONE HCL 5 MG PO TABS: 5.0000 mg | ORAL_TABLET | ORAL | Status: DC | PRN

## 2016-06-18 MED ORDER — PROPOFOL 10 MG/ML IV BOLUS
INTRAVENOUS | Status: DC | PRN
  Administered 2016-06-18: 200 mg via INTRAVENOUS

## 2016-06-18 MED ORDER — PROPOFOL 10 MG/ML IV BOLUS
INTRAVENOUS | Status: AC
  Filled 2016-06-18: qty 20

## 2016-06-18 MED ORDER — SODIUM CHLORIDE 0.9% FLUSH: 3.0000 mL | Freq: Two times a day (BID) | INTRAVENOUS | Status: DC

## 2016-06-18 MED ORDER — SULFAMETHOXAZOLE-TRIMETHOPRIM 800-160 MG PO TABS: 1.0000 | ORAL_TABLET | Freq: Two times a day (BID) | ORAL | 1 refills | Status: DC

## 2016-06-18 MED ORDER — MIDAZOLAM HCL 5 MG/5ML IJ SOLN
INTRAMUSCULAR | Status: DC | PRN
  Administered 2016-06-18: 2 mg via INTRAVENOUS

## 2016-06-18 MED ORDER — SODIUM CHLORIDE 0.9 % IR SOLN
Status: DC | PRN
  Administered 2016-06-18: 1000 mL

## 2016-06-18 MED ORDER — FENTANYL CITRATE (PF) 100 MCG/2ML IJ SOLN
INTRAMUSCULAR | Status: AC
  Filled 2016-06-18: qty 2

## 2016-06-18 MED ORDER — ONDANSETRON HCL 4 MG/2ML IJ SOLN
INTRAMUSCULAR | Status: DC | PRN
  Administered 2016-06-18: 4 mg via INTRAVENOUS

## 2016-06-18 MED ORDER — BUPIVACAINE-EPINEPHRINE (PF) 0.25% -1:200000 IJ SOLN
INTRAMUSCULAR | Status: DC | PRN
  Administered 2016-06-18: 30 mL

## 2016-06-18 MED ORDER — DEXAMETHASONE SODIUM PHOSPHATE 10 MG/ML IJ SOLN
INTRAMUSCULAR | Status: AC
  Filled 2016-06-18: qty 1

## 2016-06-18 MED ORDER — MIDAZOLAM HCL 2 MG/2ML IJ SOLN
INTRAMUSCULAR | Status: AC
  Filled 2016-06-18: qty 2

## 2016-06-18 MED ORDER — LACTATED RINGERS IV SOLN
INTRAVENOUS | Status: DC
  Administered 2016-06-18: 13:00:00 via INTRAVENOUS

## 2016-06-18 MED ORDER — ACETAMINOPHEN 325 MG PO TABS: 650.0000 mg | ORAL_TABLET | ORAL | Status: DC | PRN

## 2016-06-18 MED ORDER — DEXAMETHASONE SODIUM PHOSPHATE 10 MG/ML IJ SOLN
INTRAMUSCULAR | Status: DC | PRN
  Administered 2016-06-18: 10 mg via INTRAVENOUS

## 2016-06-18 MED ORDER — CEFAZOLIN SODIUM-DEXTROSE 2-4 GM/100ML-% IV SOLN
INTRAVENOUS | Status: AC
  Filled 2016-06-18: qty 100

## 2016-06-18 MED ORDER — SODIUM CHLORIDE 0.9 % IV SOLN: 250.0000 mL | INTRAVENOUS | Status: DC | PRN

## 2016-06-18 MED ORDER — FENTANYL CITRATE (PF) 100 MCG/2ML IJ SOLN
INTRAMUSCULAR | Status: DC | PRN
  Administered 2016-06-18: 50 ug via INTRAVENOUS

## 2016-06-18 MED ORDER — ROCURONIUM BROMIDE 100 MG/10ML IV SOLN
INTRAVENOUS | Status: AC
  Filled 2016-06-18: qty 1

## 2016-06-18 MED ORDER — FENTANYL CITRATE (PF) 100 MCG/2ML IJ SOLN: 25.0000 ug | INTRAMUSCULAR | Status: DC | PRN

## 2016-06-18 MED ORDER — CHLORHEXIDINE GLUCONATE CLOTH 2 % EX PADS: 6.0000 | MEDICATED_PAD | Freq: Once | CUTANEOUS | Status: DC

## 2016-06-18 MED ORDER — MORPHINE SULFATE (PF) 10 MG/ML IV SOLN: 1.0000 mg | INTRAVENOUS | Status: DC | PRN

## 2016-06-18 MED ORDER — OXYCODONE-ACETAMINOPHEN 5-325 MG PO TABS: 1.0000 | ORAL_TABLET | ORAL | 0 refills | Status: DC | PRN

## 2016-06-18 MED ORDER — PHENYLEPHRINE 40 MCG/ML (10ML) SYRINGE FOR IV PUSH (FOR BLOOD PRESSURE SUPPORT)
PREFILLED_SYRINGE | INTRAVENOUS | Status: AC
  Filled 2016-06-18: qty 10

## 2016-06-18 MED ORDER — CEFAZOLIN SODIUM-DEXTROSE 2-4 GM/100ML-% IV SOLN
2.0000 g | INTRAVENOUS | Status: AC
  Administered 2016-06-18: 2 g via INTRAVENOUS

## 2016-06-18 MED ORDER — LIDOCAINE 2% (20 MG/ML) 5 ML SYRINGE
INTRAMUSCULAR | Status: DC | PRN
  Administered 2016-06-18: 80 mg via INTRAVENOUS

## 2016-06-18 MED ORDER — PROMETHAZINE HCL 25 MG/ML IJ SOLN: 6.2500 mg | INTRAMUSCULAR | Status: DC | PRN

## 2016-06-18 MED ORDER — SODIUM CHLORIDE 0.9% FLUSH: 3.0000 mL | INTRAVENOUS | Status: DC | PRN

## 2016-06-18 MED ORDER — ACETAMINOPHEN 650 MG RE SUPP: 650.0000 mg | RECTAL | Status: DC | PRN

## 2016-06-18 MED ORDER — PHENYLEPHRINE HCL 10 MG/ML IJ SOLN
INTRAMUSCULAR | Status: DC | PRN
  Administered 2016-06-18 (×2): 40 ug via INTRAVENOUS

## 2016-06-18 MED FILL — OXYCODONE/APAP 5/325 MG TAB: 5-325 | 4 days supply | Qty: 40 | Fill #0

## 2016-06-18 MED FILL — SULFAMETHOXAZOLE-TMP DS TAB: 800-160 | 10 days supply | Qty: 20 | Fill #0

## 2016-06-18 SURGICAL SUPPLY — 28 items
APPLIER CLIP 9.375 MED OPEN (MISCELLANEOUS)
APR CLP MED 9.3 20 MLT OPN (MISCELLANEOUS)
BINDER BREAST LRG (GAUZE/BANDAGES/DRESSINGS) ×1 IMPLANT
CHLORAPREP W/TINT 26ML (MISCELLANEOUS) ×2 IMPLANT
CLIP APPLIE 9.375 MED OPEN (MISCELLANEOUS) IMPLANT
COVER PROBE W GEL 5X96 (DRAPES) IMPLANT
COVER SURGICAL LIGHT HANDLE (MISCELLANEOUS) ×4 IMPLANT
DECANTER SPIKE VIAL GLASS SM (MISCELLANEOUS) ×2 IMPLANT
DRSG PAD ABDOMINAL 8X10 ST (GAUZE/BANDAGES/DRESSINGS) ×1 IMPLANT
ELECT REM PT RETURN 9FT ADLT (ELECTROSURGICAL) ×2
ELECTRODE REM PT RTRN 9FT ADLT (ELECTROSURGICAL) ×1 IMPLANT
GAUZE SPONGE 4X4 12PLY STRL (GAUZE/BANDAGES/DRESSINGS) ×1 IMPLANT
GLOVE SURG SIGNA 7.5 PF LTX (GLOVE) ×4 IMPLANT
GOWN STRL REUS W/ TWL XL LVL3 (GOWN DISPOSABLE) ×1 IMPLANT
GOWN STRL REUS W/TWL LRG LVL3 (GOWN DISPOSABLE) ×2 IMPLANT
GOWN STRL REUS W/TWL XL LVL3 (GOWN DISPOSABLE) ×2
KIT BASIN OR (CUSTOM PROCEDURE TRAY) ×2 IMPLANT
KIT MARKER MARGIN INK (KITS) IMPLANT
LIQUID BAND (GAUZE/BANDAGES/DRESSINGS) ×2 IMPLANT
NS IRRIG 1000ML POUR BTL (IV SOLUTION) ×2 IMPLANT
PACK GENERAL/GYN (CUSTOM PROCEDURE TRAY) ×2 IMPLANT
SPONGE LAP 4X18 X RAY DECT (DISPOSABLE) ×2 IMPLANT
STAPLER VISISTAT 35W (STAPLE) ×2 IMPLANT
SUT MNCRL AB 4-0 PS2 18 (SUTURE) ×2 IMPLANT
SUT SILK 2 0 SH (SUTURE) IMPLANT
SUT VIC AB 3-0 SH 18 (SUTURE) ×2 IMPLANT
SYR CONTROL 10ML LL (SYRINGE) ×2 IMPLANT
TOWEL OR 17X26 10 PK STRL BLUE (TOWEL DISPOSABLE) ×2 IMPLANT

## 2016-06-18 NOTE — Anesthesia Preprocedure Evaluation (Addendum)
Anesthesia Evaluation  Patient identified by MRN, date of birth, ID band Patient awake    Reviewed: Allergy & Precautions, NPO status , Patient's Chart, lab work & pertinent test results  Airway Mallampati: II  TM Distance: >3 FB Neck ROM: Full    Dental no notable dental hx.    Pulmonary pneumonia, resolved,    Pulmonary exam normal breath sounds clear to auscultation       Cardiovascular negative cardio ROS Normal cardiovascular exam Rhythm:Regular Rate:Normal     Neuro/Psych PSYCHIATRIC DISORDERS Anxiety Depression negative neurological ROS     GI/Hepatic negative GI ROS, Neg liver ROS,   Endo/Other  negative endocrine ROS  Renal/GU negative Renal ROS  negative genitourinary   Musculoskeletal negative musculoskeletal ROS (+)   Abdominal (+) + obese,   Peds negative pediatric ROS (+)  Hematology negative hematology ROS (+)   Anesthesia Other Findings   Reproductive/Obstetrics negative OB ROS Negative pregnancy test 06/10/16   On mirena                           Anesthesia Physical Anesthesia Plan  ASA: II  Anesthesia Plan: General   Post-op Pain Management:    Induction: Intravenous  Airway Management Planned: LMA  Additional Equipment:   Intra-op Plan:   Post-operative Plan: Extubation in OR  Informed Consent: I have reviewed the patients History and Physical, chart, labs and discussed the procedure including the risks, benefits and alternatives for the proposed anesthesia with the patient or authorized representative who has indicated his/her understanding and acceptance.   Dental advisory given  Plan Discussed with: CRNA  Anesthesia Plan Comments:         Anesthesia Quick Evaluation

## 2016-06-18 NOTE — Anesthesia Procedure Notes (Signed)
Procedure Name: LMA Insertion Date/Time: 06/18/2016 1:16 PM Performed by: Renaldo FiddlerHOM, Johnay Mano EVETTE Pre-anesthesia Checklist: Patient identified and Suction available Patient Re-evaluated:Patient Re-evaluated prior to inductionOxygen Delivery Method: Circle system utilized Preoxygenation: Pre-oxygenation with 100% oxygen Intubation Type: IV induction Ventilation: Mask ventilation without difficulty LMA: LMA inserted LMA Size: 4.0 Number of attempts: 1 Airway Equipment and Method: Patient positioned with wedge pillow Placement Confirmation: positive ETCO2,  CO2 detector and breath sounds checked- equal and bilateral Tube secured with: Tape Dental Injury: Teeth and Oropharynx as per pre-operative assessment

## 2016-06-18 NOTE — Discharge Instructions (Signed)
General Anesthesia, Adult, Care After Refer to this sheet in the next few weeks. These instructions provide you with information on caring for yourself after your procedure. Your health care provider may also give you more specific instructions. Your treatment has been planned according to current medical practices, but problems sometimes occur. Call your health care provider if you have any problems or questions after your procedure. WHAT TO EXPECT AFTER THE PROCEDURE After the procedure, it is typical to experience:  Sleepiness.  Nausea and vomiting. HOME CARE INSTRUCTIONS  For the first 24 hours after general anesthesia:  Have a responsible person with you.  Do not drive a car. If you are alone, do not take public transportation.  Do not drink alcohol.  Do not take medicine that has not been prescribed by your health care provider.  Do not sign important papers or make important decisions.  You may resume a normal diet and activities as directed by your health care provider.  Change bandages (dressings) as directed.  If you have questions or problems that seem related to general anesthesia, call the hospital and ask for the anesthetist or anesthesiologist on call. SEEK MEDICAL CARE IF:  You have nausea and vomiting that continue the day after anesthesia.  You develop a rash. SEEK IMMEDIATE MEDICAL CARE IF:   You have difficulty breathing.  You have chest pain.  You have any allergic problems.   This information is not intended to replace advice given to you by your health care provider. Make sure you discuss any questions you have with your health care provider.   Document Released: 01/05/2001 Document Revised: 10/20/2014 Document Reviewed: 01/28/2012 Elsevier Interactive Patient Education 2016 [employer]       Ok to English as a second language teachershower tomorrow.  Just take bandage off and put a new one (dry guaze ) back on.  Replace bandage as needed.  Expect drainage.  Ice pack and  ibuprofen also for pain   [facility] Office Phone Number [phone]     BREAST BIOPSY/ PARTIAL MASTECTOMY: POST OP INSTRUCTIONS  Always review your discharge instruction sheet given to you by the facility where your surgery was performed.  IF YOU HAVE DISABILITY OR FAMILY LEAVE FORMS, YOU MUST BRING THEM TO THE OFFICE FOR PROCESSING.  DO NOT GIVE THEM TO YOUR DOCTOR.  1. A prescription for pain medication may be given to you upon discharge.  Take your pain medication as prescribed, if needed.  If narcotic pain medicine is not needed, then you may take acetaminophen (Tylenol) or ibuprofen (Advil) as needed. 2. Take your usually prescribed medications unless otherwise directed 3. If you need a refill on your pain medication, please contact your pharmacy.  They will contact our office to request authorization.  Prescriptions will not be filled after 5pm or on week-ends. 4. You should eat very light the first 24 hours after surgery, such as soup, crackers, pudding, etc.  Resume your normal diet the day after surgery. 5. Most patients will experience some swelling and bruising in the breast.  Ice packs and a good support bra will help.  Swelling and bruising can take several days to resolve.  6. It is common to experience some constipation if taking pain medication after surgery.  Increasing fluid intake and taking a stool softener will usually help or prevent this problem from occurring.  A mild laxative (Milk of Magnesia or Miralax) should be taken according to package directions if there are no bowel movements after 48 hours. 7. Unless  discharge instructions indicate otherwise, you may remove your bandages 24-48 hours after surgery, and you may shower at that time.  You may have steri-strips (small skin tapes) in place directly over the incision.  These strips should be left on the skin for 7-10 days.  If your surgeon used skin glue on the incision, you may shower in 24 hours.   The glue will flake off over the next 2-3 weeks.  Any sutures or staples will be removed at the office during your follow-up visit. 8. ACTIVITIES:  You may resume regular daily activities (gradually increasing) beginning the next day.  Wearing a good support bra or sports bra minimizes pain and swelling.  You may have sexual intercourse when it is comfortable. a. You may drive when you no longer are taking prescription pain medication, you can comfortably wear a seatbelt, and you can safely maneuver your car and apply brakes. b. RETURN TO WORK:  ______________________________________________________________________________________ 9. You should see your doctor in the office for a follow-up appointment approximately two weeks after your surgery.  Your doctors nurse will typically make your follow-up appointment when she calls you with your pathology report.  Expect your pathology report 2-3 business days after your surgery.  You may call to check if you do not hear from [place] after three days. 10. OTHER INSTRUCTIONS: _______________________________________________________________________________________________ _____________________________________________________________________________________________________________________________________ _____________________________________________________________________________________________________________________________________ _____________________________________________________________________________________________________________________________________  WHEN TO CALL YOUR DOCTOR: 1. Fever over 101.0 2. Nausea and/or vomiting. 3. Extreme swelling or bruising. 4. Continued bleeding from incision. 5. Increased pain, redness, or drainage from the incision.  The clinic staff is available to answer your questions during regular business hours.  Please dont hesitate to call and ask to speak to one of the nurses for clinical concerns.  If you have a medical  emergency, go to the nearest emergency room or call 911.  A surgeon from [facility] is always on call at the hospital.  For further questions, please visit [email or web address]

## 2016-06-18 NOTE — Transfer of Care (Signed)
Immediate Anesthesia Transfer of Care Note  Patient: Margart SicklesSacha Gruenewald  Procedure(s) Performed: Procedure(s): LEFT BREAST LUMPECTOMY (Left)  Patient Location: PACU  Anesthesia Type:General  Level of Consciousness: awake, alert  and oriented  Airway & Oxygen Therapy: Patient Spontanous Breathing and Patient connected to face mask oxygen  Post-op Assessment: Report given to RN and Post -op Vital signs reviewed and stable  Post vital signs: Reviewed and stable  Last Vitals:  Vitals:   06/18/16 1211  BP: 112/68  Pulse: 88  Resp: 16  Temp: 37.1 C    Last Pain:  Vitals:   06/18/16 1211  TempSrc: Oral         Complications: No apparent anesthesia complications

## 2016-06-18 NOTE — Op Note (Signed)
LEFT BREAST LUMPECTOMY  Procedure Note  Erin Gonzales 06/18/2016   Pre-op Diagnosis: LEFT BREAST MASS WITH CHRONIC ABSCESS     Post-op Diagnosis: same  Procedure(s): LEFT BREAST LUMPECTOMY and  Drainage of chronic left breast abscess  Surgeon(s): Abigail Miyamotoouglas Kerrigan Gombos, MD  Anesthesia: General  Staff:  Circulator: Patsi Searsraci L Pozil, RN Scrub Person: Clarnce FlockAmanda M Lemons, CST; Raymondo BandKatherine J Oliphant, CST; Ubaldo GlassingMaya N Hardin, RN  Estimated Blood Loss: Minimal               Specimens: sent to path          [facility]BLACKMAN,Dartha Rozzell A   Date: 06/18/2016  Time: 1:58 PM

## 2016-06-18 NOTE — Anesthesia Postprocedure Evaluation (Signed)
Anesthesia Post Note  Patient: Erin Gonzales  Procedure(s) Performed: Procedure(s) (LRB): LEFT BREAST LUMPECTOMY (Left)  Patient location during evaluation: PACU Anesthesia Type: General Level of consciousness: awake and alert Pain management: pain level controlled Vital Signs Assessment: post-procedure vital signs reviewed and stable Respiratory status: spontaneous breathing, nonlabored ventilation, respiratory function stable and patient connected to nasal cannula oxygen Cardiovascular status: blood pressure returned to baseline and stable Postop Assessment: no signs of nausea or vomiting Anesthetic complications: no    Last Vitals:  Vitals:   06/18/16 1430 06/18/16 1455  BP: 101/69 95/70  Pulse: 90 82  Resp: (!) 22 16  Temp: 36.4 C 36.8 C    Last Pain:  Vitals:   06/18/16 1430  TempSrc:   PainSc: 0-No pain                 Sebrina Kessner J

## 2016-06-18 NOTE — Interval H&P Note (Signed)
History and Physical Interval Note:no change in H and P  06/18/2016 1:01 PM  Margart SicklesSacha Plaza  has presented today for surgery, with the diagnosis of LEFT BREAST MASS WITH CHRONIC ABSCESS  The various methods of treatment have been discussed with the patient and family. After consideration of risks, benefits and other options for treatment, the patient has consented to  Procedure(s): LEFT BREAST LUMPECTOMY (Left) as a surgical intervention .  The patient's history has been reviewed, patient examined, no change in status, stable for surgery.  I have reviewed the patient's chart and labs.  Questions were answered to the patient's satisfaction.     Lenon Kuennen A

## 2016-06-19 NOTE — Op Note (Signed)
NAMMargart Sickles:  Novell, Yarelli               ACCOUNT NO.:  [id number]  MEDICAL RECORD NO.:  [id number]  LOCATION:  [facility]                         [facility]  PHYSICIAN:  Abigail Miyamotoouglas Mikyah Alamo, M.D. DATE OF BIRTH:  1986-07-06  DATE OF PROCEDURE:  06/18/2016 DATE OF DISCHARGE:  06/18/2016                              OPERATIVE REPORT   PREOPERATIVE DIAGNOSIS:  Left breast mass with chronic abscess.  POSTOPERATIVE DIAGNOSIS:  Left breast mass with chronic abscess.  PROCEDURE:  Left breast lumpectomy with drainage of chronic left breast abscess.  SURGEON:  Abigail Miyamotoouglas Shawny Borkowski, M.D.  ANESTHESIA:  General with 0.25% Marcaine.  ESTIMATED BLOOD LOSS:  Minimal.  INDICATIONS:  This is a 30 year old female, who presented initially with a nipple discharge from her left breast.  Ultrasound was performed which showed multiple dilated ducts with surrounding small masses.  Biopsies were obtained of these areas showing chronic infection.  She continued to have chronic abscesses from the breast with drainage from the previous biopsy sites.  This failed to resolve with antibiotics. Decision was made to proceed with lumpectomy of this area and drainage of the abscess.  FINDINGS:  The patient was found to have purulent fluid in the breast tissue with dilated ducts and inflammatory tissue.  I performed a lumpectomy removing a lot of this tissue going underneath the nipple areolar complex.  Cultures were also obtained of the fluid.  PROCEDURE IN DETAIL:  The patient was brought to the operating room, identified as Legrand PittsSasha Castles.  She was placed supine on the operating room table and general anesthesia was induced.  Her left breast then prepped and draped in usual sterile fashion.  I anesthetized the skin over the medial edge of the areola with Marcaine.  I then made a circumareolar incision with a scalpel.  I took this down to the breast tissue with electrocautery.  There was extensive granulation tissue  and chronic abscess at the medial inner quadrant of the left breast.  I excised tissue going underneath the nipple and then took this slightly more medially.  Several I excised all the granulation tissue and the firm masses and dilated ducts.  Again cultures were obtained of the purulence.  This did track more medially into the breast where the previous biopsy sites were and I drained more purulent fluid from these areas.  There was no odor to the fluid.  At this point, once all the purulence was removed then I excised the granulation tissue.  I irrigated the wound thoroughly with normal saline.  I then anesthetized further with Marcaine.  Achieved hemostasis with cautery.  I then placed a quarter-inch Penrose drain into the lumpectomy cavity going medially. I sutured this in place with a 3-0 nylon suture.  I then closed the incision with interrupted 3-0 nylon sutures around the drain. Gauze and ABD pads were placed over top of this.  The patient tolerated the procedure well.  All sponge and needle and instrument countswere correct at the end of procedure.  The patient was then extubated in the operating room and taken in a stable condition to recovery room.     Abigail Miyamotoouglas Monica Codd, M.D.     DB/MEDQ  D:  06/18/2016  T:  06/19/2016  Job:  [id number]

## 2016-06-22 LAB — AEROBIC CULTURE W GRAM STAIN (SUPERFICIAL SPECIMEN)

## 2016-06-22 LAB — AEROBIC CULTURE  (SUPERFICIAL SPECIMEN): CULTURE: NO GROWTH

## 2016-06-23 LAB — ANAEROBIC CULTURE

## 2016-06-30 MED FILL — SULFAMETHOXAZOLE-TMP DS TAB: 800-160 | 10 days supply | Qty: 20 | Fill #1

## 2016-07-14 ENCOUNTER — Ambulatory Visit (INDEPENDENT_AMBULATORY_CARE_PROVIDER_SITE_OTHER): Payer: 59 | Admitting: Physician Assistant

## 2016-07-14 VITALS — BP 116/74 | HR 85 | Temp 98.6°F | Resp 17 | Ht 59.0 in | Wt 175.0 lb

## 2016-07-14 DIAGNOSIS — R21 Rash and other nonspecific skin eruption: Secondary | ICD-10-CM | POA: Diagnosis not present

## 2016-07-14 MED ORDER — LEVOCETIRIZINE DIHYDROCHLORIDE 5 MG PO TABS: 5.0000 mg | ORAL_TABLET | Freq: Every evening | ORAL | 0 refills | Status: DC

## 2016-07-14 NOTE — Progress Notes (Signed)
   By signing my name below, I, Erin Gonzales, attest that this documentation has been prepared under the direction and in the presence of Deliah BostonMichael Clark, PA-C.  Electronically Signed: Andrew Auaven Gonzales, ED Scribe. 07/14/2016. 5:28 PM.  07/14/2016 5:28 PM   DOB: 04/10/1986 / MRN: [id number]  SUBJECTIVE:  Erin SicklesSacha Gonzales is a 30 y.o. female presenting for an unchanged rash that started yesterday morning at 3 am. Pt reports rash started on face but has spread to arms bilaterally. States rash itches "from the inside", but is not painful. She associates lip swelling as well as improved fever and chills. She has tried benadryl and aspirin without relief to symptoms. Pt had a lumpectomy 9/6. She currently takes oxycodone and is on her second round of bactrim,last dose being 1 day ago. Pt is pre diabetic but states she has been losing weight.  She denies eye irritation, vaginal irritation, rectal irritation, and sores in mouth.    Pt also complains of worsening appearance of wound site from lumpectomy. Her last f/u was 3 days ago and her next appointment is 7 days. She repacked wound herself today.    She is allergic to peanut-containing drug products.   She  has a past medical history of Anxiety; Depression; and Pneumonia.    She  reports that she has never smoked. She has never used smokeless tobacco. She reports that she uses drugs, including Marijuana. She reports that she does not drink alcohol. She  has no sexual activity history on file. The patient  has a past surgical history that includes Hernia repair and Breast lumpectomy (Left, 06/18/2016).  Her family history includes Cancer in her maternal grandmother, paternal grandfather, and paternal grandmother; Diabetes in her father; Hyperlipidemia in her mother.  Review of Systems  Constitutional: Negative for chills and fever.  Eyes: Negative for pain and redness.  Skin: Positive for itching and rash.    The problem list and medications were reviewed and  updated by myself where necessary and exist elsewhere in the encounter.   OBJECTIVE:  BP 116/74 (BP Location: Right Arm, Patient Position: Sitting, Cuff Size: Normal)   Pulse 85   Temp 98.6 F (37 C) (Oral)   Resp 17   Ht 4\' 11"  (1.499 m)   Wt 175 lb (79.4 kg)   LMP 06/30/2016   SpO2 100%   BMI 35.35 kg/m   Physical Exam  Constitutional: Vital signs are normal.  HENT:  Mouth/Throat: Uvula is midline, oropharynx is clear and moist and mucous membranes are normal. No posterior oropharyngeal erythema.  Skin:  Diffuse erythematous macular rash about bilateral arms. Lips negative for swelling  Tongue negative for swelling.    ASSESSMENT AND PLAN  Morbilliform rash: Advised she stop bactrim and will add this to her allergy list. There is obvious concern for progression to SJS.  She will RTC if havaing any mouth, vaginal, rectal, or eye symptoms, or she may go to the ED.  Antihistamines for now and will hold off pred as her symptoms are mild at this point.    The patient is advised to call or return to clinic if she does not see an improvement in symptoms, or to seek the care of the closest emergency department if she worsens with the above plan.   Deliah BostonMichael Clark, MHS, PA-C [facility] 07/14/2016 5:28 PM

## 2016-07-14 NOTE — Patient Instructions (Addendum)
  If you develop any eye, mouth, vaginal or rectal irritation then come back to clinic or go directly to the ed.    IF you received an x-ray today, you will receive an invoice from [facility]. Please contact [facility] at [phone] with questions or concerns regarding your invoice.   IF you received labwork today, you will receive an invoice from [facility]. Please contact [facility] at [phone] with questions or concerns regarding your invoice.   Our billing staff will not be able to assist you with questions regarding bills from these companies.  You will be contacted with the lab results as soon as they are available. The fastest way to get your results is to activate your My Chart account. Instructions are located on the last page of this paperwork. If you have not heard from us regarding the results in 2 weeks, please contact this office.

## 2016-07-18 ENCOUNTER — Ambulatory Visit (INDEPENDENT_AMBULATORY_CARE_PROVIDER_SITE_OTHER): Payer: 59 | Admitting: Physician Assistant

## 2016-07-18 VITALS — BP 114/62 | HR 96 | Temp 98.3°F | Resp 18 | Ht 59.0 in | Wt 176.0 lb

## 2016-07-18 DIAGNOSIS — N898 Other specified noninflammatory disorders of vagina: Secondary | ICD-10-CM

## 2016-07-18 DIAGNOSIS — R590 Localized enlarged lymph nodes: Secondary | ICD-10-CM | POA: Diagnosis not present

## 2016-07-18 DIAGNOSIS — L299 Pruritus, unspecified: Secondary | ICD-10-CM

## 2016-07-18 LAB — POCT WET + KOH PREP
TRICH BY WET PREP: ABSENT
Yeast by KOH: ABSENT
Yeast by wet prep: ABSENT

## 2016-07-18 LAB — CBC WITH DIFFERENTIAL/PLATELET
BASOS ABS: 159 {cells}/uL (ref 0–200)
BASOS PCT: 3 %
EOS ABS: 530 {cells}/uL — AB (ref 15–500)
Eosinophils Relative: 10 %
HEMATOCRIT: 37.6 % (ref 35.0–45.0)
HEMOGLOBIN: 12.1 g/dL (ref 11.7–15.5)
Lymphocytes Relative: 38 %
Lymphs Abs: 2014 cells/uL (ref 850–3900)
MCH: 24.1 pg — AB (ref 27.0–33.0)
MCHC: 32.2 g/dL (ref 32.0–36.0)
MCV: 74.8 fL — AB (ref 80.0–100.0)
MONO ABS: 1060 {cells}/uL — AB (ref 200–950)
MONOS PCT: 20 %
MPV: 8.3 fL (ref 7.5–12.5)
NEUTROS ABS: 1537 {cells}/uL (ref 1500–7800)
Neutrophils Relative %: 29 %
PLATELETS: 359 10*3/uL (ref 140–400)
RBC: 5.03 MIL/uL (ref 3.80–5.10)
RDW: 19.1 % — ABNORMAL HIGH (ref 11.0–15.0)
WBC: 5.3 10*3/uL (ref 3.8–10.8)

## 2016-07-18 LAB — HIV ANTIBODY (ROUTINE TESTING W REFLEX): HIV 1&2 Ab, 4th Generation: NONREACTIVE

## 2016-07-18 LAB — POCT SKIN KOH: Skin KOH, POC: NEGATIVE

## 2016-07-18 MED ORDER — FLUCONAZOLE 150 MG PO TABS: 150.0000 mg | ORAL_TABLET | Freq: Once | ORAL | 0 refills | Status: AC

## 2016-07-18 NOTE — Patient Instructions (Addendum)
  You can take up to 2 Xyzal a day for itching.    IF you received an x-ray today, you will receive an invoice from [facility]. Please contact [facility] at [phone] with questions or concerns regarding your invoice.   IF you received labwork today, you will receive an invoice from [facility]. Please contact [facility] at [phone] with questions or concerns regarding your invoice.   Our billing staff will not be able to assist you with questions regarding bills from these companies.  You will be contacted with the lab results as soon as they are available. The fastest way to get your results is to activate your My Chart account. Instructions are located on the last page of this paperwork. If you have not heard from us regarding the results in 2 weeks, please contact this office.

## 2016-07-18 NOTE — Progress Notes (Signed)
Patient ID: Erin Gonzales, female   DOB: [birth year], 30 y.o.   MRN: [id number]   By signing my name below I, Shelah Lewandowsky, attest that this documentation has been prepared under the direction and in the presence of Deliah Boston, [place]. Electonically Signed. Shelah Lewandowsky, Scribe 07/18/2016 at 10:59 AM  07/18/2016 11:16 AM   DOB: Jun 11, 1986 / MRN: [id number]    SUBJECTIVE:  Erin Gonzales is a 30 y.o. female presenting for follow up reevaluation of rash. Pt also c/o today of lumps on back of neck and behind her ears bilat that was first noticed 2 days ago. Rash on arms have disappeared however is still pruritic. Pt now has pruritic rash on trunk.  Rash, swelling, and pruritis on face and lips has resolved. Pt denies rash or pruritis on legs. Pt has been taking xyzal as prescribed at last visit on 07/14/16.  Pt feels fine other than being irritated from the pruritis. Pt denies sore throat.  Pt reports vaginal bleeding started yesterday. Pt thinks it is a yeast infection and not her menstrual period.    Pt seen and evaluated at [facility] by Deliah Boston on 07/14/16 for constant pruritic rash for 2 days. HPI from that visit included below   "Erin Gonzales is a 30 y.o. female presenting for an unchanged rash that started yesterday morning at 3 am. Pt reports rash started on face but has spread to arms bilaterally. States rash itches "from the inside", but is not painful. She associates lip swelling as well as improved fever and chills. She has tried benadryl and aspirin without relief to symptoms. Pt had a lumpectomy 9/6. She currently takes oxycodone and is on her second round of bactrim,last dose being 1 day ago. Pt is pre diabetic but states she has been losing weight.  She denies eye irritation, vaginal irritation, rectal irritation, and sores in mouth.    Pt also complains of worsening appearance of wound site from lumpectomy. Her last f/u was 3 days ago and her next appointment is 7 days. She repacked  wound herself today.    She is allergic to peanut-containing drug products.   She  has a past medical history of Anxiety; Depression; and Pneumonia.    She  reports that she has never smoked. She has never used smokeless tobacco. She reports that she uses drugs, including Marijuana. She reports that she does not drink alcohol. She  has no sexual activity history on file. The patient  has a past surgical history that includes Hernia repair and Breast lumpectomy (Left, 06/18/2016).  Her family history includes Cancer in her maternal grandmother, paternal grandfather, and paternal grandmother; Diabetes in her father; Hyperlipidemia in her mother."     She is allergic to septra [sulfamethoxazole-trimethoprim] and peanut-containing drug products.   She  has a past medical history of Anxiety; Depression; and Pneumonia.    She  reports that she has never smoked. She has never used smokeless tobacco. She reports that she uses drugs, including Marijuana. She reports that she does not drink alcohol. She  has no sexual activity history on file. The patient  has a past surgical history that includes Hernia repair and Breast lumpectomy (Left, 06/18/2016).  Her family history includes Cancer in her maternal grandmother, paternal grandfather, and paternal grandmother; Diabetes in her father; Hyperlipidemia in her mother.  Review of Systems  Constitutional: Negative for fever and malaise/fatigue.  HENT: Negative for sore throat.   Genitourinary:       Pt is positive  for vaginal bleeding  Skin: Positive for rash (chest and abd).    The problem list and medications were reviewed and updated by myself where necessary and exist elsewhere in the encounter.   OBJECTIVE:  BP 114/62    Pulse 96    Temp 98.3 F (36.8 C) (Oral)    Resp 18    Ht 4\' 11"  (1.499 m)    Wt 176 lb (79.8 kg)    LMP 06/30/2016    SpO2 100%    BMI 35.55 kg/m   Physical Exam  Constitutional: She is oriented to person, place, and time.  She appears well-developed and well-nourished. No distress.  HENT:  Head: Normocephalic and atraumatic.  Nose: Mucosal edema (L>R) present.  Mouth/Throat: Mucous membranes are normal. No oral lesions. No oropharyngeal exudate, posterior oropharyngeal edema, posterior oropharyngeal erythema or tonsillar abscesses.  Eyes: Conjunctivae are normal. Pupils are equal, round, and reactive to light.  Neck: Neck supple.  Cardiovascular: Normal rate.   Pulmonary/Chest: Effort normal.  Genitourinary: There is no rash on the right labia. There is no rash on the left labia. Cervix exhibits discharge (thin, whitish). Cervix exhibits no motion tenderness. Right adnexum displays no mass and no tenderness. Left adnexum displays no mass and no tenderness. No erythema, tenderness or bleeding in the vagina.  Genitourinary Comments: Pt's vaginal mucosa is normal. There is no rash around labia, vagina, or rectum. Pt has a thin whitish cervical discharge.  Musculoskeletal: Normal range of motion.  Lymphadenopathy:       Head (right side): Occipital (posterior) adenopathy present. No submandibular, no tonsillar, no preauricular and no posterior auricular adenopathy present.       Head (left side): Occipital (posterior) adenopathy present. No submandibular, no tonsillar, no preauricular and no posterior auricular adenopathy present.    She has no cervical adenopathy.  Neurological: She is alert and oriented to person, place, and time.  Skin: Skin is warm and dry.  Rash on pt's arms have resolved. There is no rash on back. Pt has a mild papular rash at back of neck, no redness, tenderness, increased warmth.  Psychiatric: She has a normal mood and affect. Her behavior is normal.  Nursing note and vitals reviewed.   Results for orders placed or performed in visit on 07/18/16 (from the past 72 hour(s))  POCT Wet + KOH Prep     Status: Abnormal   Collection Time: 07/18/16 11:07 AM  Result Value Ref Range   Yeast by  KOH Absent Present, Absent   Yeast by wet prep Absent Present, Absent   WBC by wet prep Many (A) None, Few, Too numerous to count   Clue Cells Wet Prep HPF POC None None, Too numerous to count   Trich by wet prep Absent Present, Absent   Bacteria Wet Prep HPF POC Many (A) None, Few, Too numerous to count   Epithelial Cells By [employer] ([employer]) Few None, Few, Too numerous to count   RBC,UR,HPF,POC None None RBC/hpf    No results found.  ASSESSMENT AND PLAN  Diana was seen today for follow-up and other.  Diagnoses and all orders for this visit:  Itching: Unkwon etiology.  Screening via problem two. It appears the concern for SJS has resolved as she has no rash.   -     POCT Wet + KOH Prep -     POCT Skin KOH  Lymphadenopathy, occipital: Will check for common causes.  Negative for cat scratches. If perist longer than three weeks  will US.  -     CMV IgM -     Epstein-Barr virus VCA, IgM -     HIV antibody -     CBC with Differential/Platelet  Vaginal discharge: Negative for any lesions on exam.  Mild white discharge and no clues.  Will treat for yeast.  -     fluconazole (DIFLUCAN) 150 MG tablet; Take 1 tablet (150 mg total) by mouth once. Repeat if needed     The patient is advised to call or return to clinic if she does not see an improvement in symptoms, or to seek the care of the closest emergency department if she worsens with the above plan.   Deliah BostonMichael Clark, MHS, PA-C [facility] 07/18/2016 11:16 AM  This note was scribed in my presence and I performed the services described in the this documentation.

## 2016-07-19 ENCOUNTER — Encounter (HOSPITAL_COMMUNITY): Payer: Self-pay | Admitting: Emergency Medicine

## 2016-07-19 ENCOUNTER — Emergency Department (HOSPITAL_COMMUNITY)
Admission: EM | Admit: 2016-07-19 | Discharge: 2016-07-19 | Disposition: A | Discharge status: Home / Self Care | Payer: 59 | Attending: Emergency Medicine | Admitting: Emergency Medicine

## 2016-07-19 DIAGNOSIS — R21 Rash and other nonspecific skin eruption: Secondary | ICD-10-CM | POA: Diagnosis present

## 2016-07-19 DIAGNOSIS — L270 Generalized skin eruption due to drugs and medicaments taken internally: Secondary | ICD-10-CM | POA: Insufficient documentation

## 2016-07-19 DIAGNOSIS — Z91010 Allergy to peanuts: Secondary | ICD-10-CM | POA: Diagnosis not present

## 2016-07-19 MED ORDER — METHYLPREDNISOLONE 4 MG PO TBPK: ORAL_TABLET | ORAL | 0 refills | Status: DC

## 2016-07-19 MED ORDER — HYDROXYZINE HCL 25 MG PO TABS
25.0000 mg | ORAL_TABLET | Freq: Once | ORAL | Status: AC
  Administered 2016-07-19: 25 mg via ORAL
  Filled 2016-07-19: qty 1

## 2016-07-19 MED ORDER — HYDROXYZINE HCL 25 MG PO TABS: 25.0000 mg | ORAL_TABLET | Freq: Four times a day (QID) | ORAL | 0 refills | Status: DC | PRN

## 2016-07-19 NOTE — ED Notes (Signed)
Per lab glucose 1034 will notify MD.

## 2016-07-19 NOTE — ED Notes (Signed)
Patient d/c'd self care.  F/U and medications reviewed.  Patient verbalized understanding. 

## 2016-07-19 NOTE — ED Triage Notes (Signed)
Pt from home with complaints of itching on her skin that began when she began Septra on Sunday. Pt states she has been seen for this, but nothing is helping reduce her symptoms. Pt has redness and bumps on her abdomen, arms and chest. Pt also has swollen masses behind both ears and at the base of her skull. Pt states the bumps showed up on Wednesday. Pt recently had a lumpectomy on her left breast on Sept 6th.

## 2016-07-19 NOTE — ED Provider Notes (Signed)
[facility] DEPT Provider Note: Georgena Spurling, MD, FACEP  CSN: [id number] MRN: [id number] ARRIVAL: 07/19/16 at [place] is a 30 y.o. female who underwent a lumpectomy of the left breast on September 6. She developed an infection at the site and was placed on 2 rounds of Septra. She developed a rash 6 days ago which was primarily on her abdomen, chest and arms. She was seen at an urgent care and placed on levocetirizine with improvement for several days; there was no evidence of Stevens-Johnson syndrome at that time. The rash rebounded 3 days ago. It is not painful but it is very pruritic. It is hyperpigmented. She denies any mucosal involvement of the eyes, mouth or genitalia. She denies shortness of breath. She has noted some enlarged lymph nodes to the back of her head.    Past Medical History:  Diagnosis Date  . Anxiety   . Depression   . Pneumonia    02/2016     Past Surgical History:  Procedure Laterality Date  . BREAST LUMPECTOMY Left 06/18/2016   Procedure: LEFT BREAST LUMPECTOMY;  Surgeon: Coralie Keens, MD;  Location: [facility];  Service: General;  Laterality: Left;  . HERNIA REPAIR     2 months old     Family History  Problem Relation Age of Onset  . Hyperlipidemia Mother   . Diabetes Father   . Cancer Maternal Grandmother   . Cancer Paternal Grandmother   . Cancer Paternal Grandfather     Social History  Substance Use Topics  . Smoking status: Never Smoker  . Smokeless tobacco: Never Used  . Alcohol use No    Prior to Admission medications   Medication Sig Start Date End Date Taking? Authorizing Provider  acetaminophen (TYLENOL) 500 MG tablet Take 1,000 mg by mouth every 6 (six) hours as needed for moderate pain.   Yes Historical Provider, MD  clonazePAM (KLONOPIN) 0.5 MG tablet Take 0.5 mg by mouth 2 (two) times daily as needed for anxiety.   Yes Historical Provider, MD    ergocalciferol (VITAMIN D2) 50000 units capsule Take 50,000 Units by mouth 2 (two) times a week.   Yes Historical Provider, MD  fluconazole (DIFLUCAN) 150 MG tablet Take 150 mg by mouth once.   Yes Historical Provider, MD  levocetirizine (XYZAL) 5 MG tablet Take 1 tablet (5 mg total) by mouth every evening. 07/14/16  Yes Tereasa Coop, PA-C  oxyCODONE-acetaminophen (ROXICET) 5-325 MG tablet Take 1-2 tablets by mouth every 4 (four) hours as needed for moderate pain or severe pain. 06/18/16  Yes Coralie Keens, MD  levonorgestrel (MIRENA) 20 MCG/24HR IUD 1 each by Intrauterine route once.    Historical Provider, MD    Allergies Septra [sulfamethoxazole-trimethoprim] and Peanut-containing drug products   REVIEW OF SYSTEMS  Negative except as noted here or in the History of Present Illness.   PHYSICAL EXAMINATION  Initial Vital Signs Blood pressure 121/71, pulse 116, temperature 99.1 F (37.3 C), temperature source Oral, resp. rate 16, height _0  (1.499 m), weight 176 lb (79.8 kg), last menstrual period 06/30/2016, SpO2 100 %.  Examination General: Well-developed, well-nourished female in no acute distress; appearance consistent with age of record HENT: normocephalic; atraumatic; no mucosal inflammation; occipital lymphadenopathy Eyes: pupils equal, round and reactive to light; extraocular muscles intact; no mucosal inflammation Neck: supple Heart: regular rate and rhythm Lungs: clear to auscultation bilaterally Abdomen: soft; nondistended; nontender Extremities:  No deformity; full range of motion; pulses normal Neurologic: Awake, alert and oriented; motor function intact in all extremities and symmetric; no facial droop Skin: Warm and dry; hyperpigmented maculopapular rash primarily of the neck, back, chest, abdomen and arms:   Psychiatric: Normal mood and affect   RESULTS  Summary of this visit's results, reviewed by myself:   EKG Interpretation  Date/Time:     Ventricular Rate:    PR Interval:    QRS Duration:   QT Interval:    QTC Calculation:   R Axis:     Text Interpretation:        Laboratory Studies: Results for orders placed or performed in visit on 07/18/16 (from the past 24 hour(s))  POCT Wet + KOH Prep     Status: Abnormal   Collection Time: 07/18/16 11:07 AM  Result Value Ref Range   Yeast by KOH Absent Present, Absent   Yeast by wet prep Absent Present, Absent   WBC by wet prep Many (A) None, Few, Too numerous to count   Clue Cells Wet Prep HPF POC None None, Too numerous to count   Trich by wet prep Absent Present, Absent   Bacteria Wet Prep HPF POC Many (A) None, Few, Too numerous to count   Epithelial Cells By [employer] ([employer]FC) Few None, Few, Too numerous to count   RBC,UR,HPF,POC None None RBC/hpf  CMV IgM     Status: None (Preliminary result)   Collection Time: 07/18/16 11:11 AM  Result Value Ref Range   CMV IgM  <30.00 AU/mL   Narrative   Performed at:  [address], [address]                [place], [place] [address]  Epstein-Barr virus VCA, IgM     Status: None (Preliminary result)   Collection Time: 07/18/16 11:11 AM  Result Value Ref Range   EBV VCA IgM  U/mL   Narrative   Performed at:  [address]                [place], [place] [address]  HIV antibody     Status: None   Collection Time: 07/18/16 11:11 AM  Result Value Ref Range   HIV 1&2 Ab, 4th Generation NONREACTIVE NONREACTIVE   Narrative   Performed at:  [address], [address]                [place], [place] [address]  CBC with Differential/Platelet     Status: Abnormal   Collection Time: 07/18/16 11:11 AM  Result Value Ref Range   WBC 5.3 3.8 - 10.8 K/uL   RBC 5.03 3.80 - 5.10 MIL/uL   Hemoglobin 12.1 11.7 - 15.5 g/dL   HCT 37.6 35.0 - 45.0 %   MCV 74.8 (L) 80.0 - 100.0 fL   MCH 24.1 (L) 27.0 - 33.0 pg   MCHC 32.2 32.0  - 36.0 g/dL   RDW 19.1 (H) 11.0 - 15.0 %   Platelets 359 140 - 400 K/uL   MPV 8.3 7.5 - 12.5 fL   Neutro Abs 1,537 1,500 - 7,800 cells/uL   Lymphs Abs 2,014 850 - 3,900  cells/uL   Monocytes Absolute 1,060 (H) 200 - 950 cells/uL   Eosinophils Absolute 530 (H) 15 - 500 cells/uL   Basophils Absolute 159 0 - 200 cells/uL   Neutrophils Relative % 29 %   Lymphocytes Relative 38 %   Monocytes Relative 20 %   Eosinophils Relative 10 %   Basophils Relative 3 %   Smear Review Criteria for review not met    Narrative   Performed at:  [address]                [place], [place] [address]  POCT Skin KOH     Status: None   Collection Time: 07/18/16 11:23 AM  Result Value Ref Range   Skin KOH, POC Negative    Imaging Studies: No results found.  ED COURSE  Nursing notes and initial vitals signs, including pulse oximetry, reviewed.  Vitals:   07/19/16 0208 07/19/16 0209  BP: 121/71   Pulse: 116   Resp: 16   Temp: 99.1 F (37.3 C)   TempSrc: Oral   SpO2: 100%   Weight:  176 lb (79.8 kg)  Height:  _0  (1.499 m)   We will prescribe hydroxyzine as it may handle her itching better. She was advised that it may cause drowsiness. We will try a steroid taper. She was advised to monitor her sugar as she does have a history of borderline diabetes.  PROCEDURES    ED DIAGNOSES     ICD-9-CM ICD-10-CM   1. Drug-induced skin rash 693.0 L27.0        Shanon Rosser, MD 07/19/16 [phone]

## 2016-07-21 LAB — EPSTEIN-BARR VIRUS VCA, IGM

## 2016-07-22 LAB — CMV IGM: CMV IgM: <30 AU/mL (ref <30.00)

## 2016-07-22 NOTE — Progress Notes (Signed)
Okay to send a letter. She is negative for EBV and CMV.  If her lymphadenopathy remains after a total of three weeks it is advisable that she RTC for further/repeat testing and possibly US of the affected nodes, however her work up is reassuring.  Work up does show a microcytosis however she is not anemic.  This is most likely iron deficiency however she is not anemic so I think it is okay to watch this in follow up.  Deliah BostonMichael Odesser Tourangeau, MS, PA-C 3:11 PM, 07/22/2016

## 2016-07-23 ENCOUNTER — Encounter (HOSPITAL_COMMUNITY): Payer: Self-pay | Admitting: Family Medicine

## 2016-07-23 ENCOUNTER — Emergency Department (HOSPITAL_COMMUNITY)
Admission: EM | Admit: 2016-07-23 | Discharge: 2016-07-24 | Disposition: A | Discharge status: Home / Self Care | Payer: 59 | Attending: Emergency Medicine | Admitting: Emergency Medicine

## 2016-07-23 DIAGNOSIS — T7840XD Allergy, unspecified, subsequent encounter: Secondary | ICD-10-CM | POA: Diagnosis present

## 2016-07-23 DIAGNOSIS — Z79899 Other long term (current) drug therapy: Secondary | ICD-10-CM | POA: Diagnosis not present

## 2016-07-23 MED ORDER — DIPHENHYDRAMINE HCL 50 MG/ML IJ SOLN
25.0000 mg | Freq: Once | INTRAMUSCULAR | Status: AC
  Administered 2016-07-24: 25 mg via INTRAVENOUS
  Filled 2016-07-23: qty 1

## 2016-07-23 MED ORDER — FAMOTIDINE IN NACL 20-0.9 MG/50ML-% IV SOLN
20.0000 mg | Freq: Once | INTRAVENOUS | Status: AC
  Administered 2016-07-24: 20 mg via INTRAVENOUS
  Filled 2016-07-23: qty 50

## 2016-07-23 MED ORDER — DEXAMETHASONE SODIUM PHOSPHATE 10 MG/ML IJ SOLN
10.0000 mg | Freq: Once | INTRAMUSCULAR | Status: AC
  Administered 2016-07-24: 10 mg via INTRAVENOUS
  Filled 2016-07-23: qty 1

## 2016-07-23 NOTE — ED Triage Notes (Signed)
Patient reports having an allergic reaction for 11 days. Currently patient has a rash, facial swelling around mostly eyes, and redness of the skin. Pt reports [facility], and [facility] for same symptom. Pt has been taking Benadryl, Methylprednisolone, Hydroxyzine, and Levocetirizine for the symptoms. Pt reports symptoms have got worse by increased swelling and more of body surface.

## 2016-07-23 NOTE — ED Provider Notes (Signed)
[facility]-EMERGENCY DEPT Provider Note   CSN: [id number] Arrival date & time: 07/23/16  2207  By signing my name below, I, Alyssa Grove, attest that this documentation has been prepared under the direction and in the presence of Shon Baton, MD. Electronically Signed: Alyssa Grove, ED Scribe. 07/23/16. 11:36 PM.   History   Chief Complaint Chief Complaint  Patient presents with  . Allergic Reaction   The history is provided by the patient. No language interpreter was used.   HPI Comments: Gelena Klosinski is a 30 y.o. female who presents to the Emergency Department complaining of gradual onset and worsening, constant allergic reaction onset 11 days PTA. She notes she has rash to the face, chest, bilateral hands, arms, and lower legs. She also is experiencing facial swelling around her eyes and diffuse erythema. Pt took Bactrim and on the last day she started to develop hives in multiple areas. Pt has been to [facility] on 10/02 and 10/06 and [facility] on 10/08 for the same symptoms before and was prescribed medications that include Benadryl, Methylprednisolone, Hydroxyzine, and Levocetirizine that she reports have only masked the symptoms. Pt has recently had left breast lumpectomy. No other new detergents, soaps, foods. Pt has not had allergic reactions before. Denies nausea, vomiting, diarrhea, and shortness of breath.  Past Medical History:  Diagnosis Date  . Anxiety   . Depression   . Pneumonia    02/2016     Patient Active Problem List   Diagnosis Date Noted  . CONSTIPATION 05/11/2009    Past Surgical History:  Procedure Laterality Date  . BREAST LUMPECTOMY Left 06/18/2016   Procedure: LEFT BREAST LUMPECTOMY;  Surgeon: Abigail Miyamoto, MD;  Location: [facility] ORS;  Service: General;  Laterality: Left;  . BREAST LUMPECTOMY     Left  . HERNIA REPAIR     2 months old     OB History    No data available       Home Medications    Prior to Admission medications   Medication  Sig Start Date End Date Taking? Authorizing Provider  acetaminophen (TYLENOL) 500 MG tablet Take 1,000 mg by mouth every 6 (six) hours as needed for moderate pain.   Yes Historical Provider, MD  diphenhydrAMINE (BENADRYL) 25 mg capsule Take 25 mg by mouth every 6 (six) hours as needed for itching.   Yes Historical Provider, MD  hydrOXYzine (ATARAX/VISTARIL) 25 MG tablet Take 1-2 tablets (25-50 mg total) by mouth every 6 (six) hours as needed (for rash/itching; may cause drowsiness). 07/19/16  Yes John Molpus, MD  levocetirizine (XYZAL) 5 MG tablet Take 5 mg by mouth every evening.   Yes Historical Provider, MD  methylPREDNISolone (MEDROL DOSEPAK) 4 MG TBPK tablet Take tapering dose per package instructions. May cause elevated blood sugar. 07/19/16  Yes John Molpus, MD  diphenhydrAMINE (BENADRYL) 25 MG tablet Take 1 tablet (25 mg total) by mouth every 6 (six) hours. 07/24/16   Shon Baton, MD  EPINEPHrine 0.3 mg/0.3 mL IJ SOAJ injection Inject 0.3 mLs (0.3 mg total) into the muscle once. 07/24/16 07/24/16  Shon Baton, MD  famotidine (PEPCID) 20 MG tablet Take 1 tablet (20 mg total) by mouth 2 (two) times daily. 07/24/16   Shon Baton, MD  levonorgestrel (MIRENA) 20 MCG/24HR IUD 1 each by Intrauterine route once.    Historical Provider, MD  predniSONE (DELTASONE) 10 MG tablet Take 50 mg by mouth daily for 3 days, then take 40 mg by mouth daily for  3 days, then take 30 mg by mouth for 3 days, then take 20 mg by mouth for 3 days, then take 10 mg by mouth for 3 days, then take 5 mg by mouth for 3 days 07/24/16   Shon Baton, MD    Family History Family History  Problem Relation Age of Onset  . Hyperlipidemia Mother   . Diabetes Father   . Cancer Maternal Grandmother   . Cancer Paternal Grandmother   . Cancer Paternal Grandfather     Social History Social History  Substance Use Topics  . Smoking status: Never Smoker  . Smokeless tobacco: Never Used  . Alcohol use No      Allergies   Septra [sulfamethoxazole-trimethoprim] and Peanut-containing drug products   Review of Systems Review of Systems  Constitutional: Negative for fever.  HENT:       Facial swelling  Respiratory: Negative for shortness of breath.   Cardiovascular: Negative for chest pain.  Gastrointestinal: Negative for abdominal pain, nausea and vomiting.  Skin: Positive for rash.  All other systems reviewed and are negative.  Physical Exam Updated Vital Signs BP 104/69 (BP Location: Left Arm)   Pulse 74   Temp 98.2 F (36.8 C) (Oral)   Resp 17   Ht 4\' 11"  (1.499 m)   Wt 176 lb (79.8 kg)   LMP 06/30/2016   SpO2 99%   BMI 35.55 kg/m   Physical Exam  Constitutional: She is oriented to person, place, and time. She appears well-developed and well-nourished.  ABCs intact  HENT:  Head: Normocephalic and atraumatic.  Diffuse facial swelling and eye swelling, oropharynx moist and clear without swelling  Eyes: Pupils are equal, round, and reactive to light.  Cardiovascular: Normal rate and regular rhythm.   No murmur heard. Pulmonary/Chest: Effort normal. No respiratory distress. She has no wheezes.  Abdominal: Soft. Bowel sounds are normal. There is no tenderness. There is no guarding.  Neurological: She is alert and oriented to person, place, and time.  Skin: Skin is warm and dry.  Urticaria noted over the face, bilateral hands, chest, under the breasts, lichenification noted  Psychiatric: She has a normal mood and affect.  Nursing note and vitals reviewed.    ED Treatments / Results  DIAGNOSTIC STUDIES: Oxygen Saturation is 100% on RA, normal by my interpretation.    COORDINATION OF CARE: 11:04 PM Discussed treatment plan with pt at bedside which includes Decadron, Benadryl, Pepcid, but CBC and BMP and pt agreed to plan.  Labs (all labs ordered are listed, but only abnormal results are displayed) Labs Reviewed  CBC WITH DIFFERENTIAL/PLATELET - Abnormal; Notable  for the following:       Result Value   RBC 5.38 (*)    MCV 75.5 (*)    MCH 24.2 (*)    RDW 19.0 (*)    Monocytes Absolute 1.1 (*)    Eosinophils Absolute 2.2 (*)    All other components within normal limits  BASIC METABOLIC PANEL - Abnormal; Notable for the following:    Calcium 8.5 (*)    All other components within normal limits    EKG  EKG Interpretation None       Radiology No results found.  Procedures Procedures (including critical care time)  Medications Ordered in ED Medications  dexamethasone (DECADRON) injection 10 mg (10 mg Intravenous Given 07/24/16 0000)  diphenhydrAMINE (BENADRYL) injection 25 mg (25 mg Intravenous Given 07/24/16 0001)  famotidine (PEPCID) IVPB 20 mg premix (20 mg Intravenous New Bag/Given  07/24/16 0003)     Initial Impression / Assessment and Plan / ED Course  I have reviewed the triage vital signs and the nursing notes.  Pertinent labs & imaging results that were available during my care of the patient were reviewed by me and considered in my medical decision making (see chart for details).  Clinical Course   Patient presents with persistent rash and itching as well as facial swelling and hand swelling. Brought on after taking Bactrim over one week ago. She has had appropriate treatment with a Medrol Dosepak, Benadryl.  She's had minimal improvement of symptoms. No signs or symptoms of anaphylaxis at this time. Patient was given IV Decadron, Benadryl, and Pepcid. No indication at this time for epinephrine.  1:34 AM Patient reports some improvement of swelling and itching. She has continued to have rash. Discussed with patient that she needs follow-up closely with her primary doctor and may need referral to an allergist. Will place on a longer higher dose steroid taper and added Pepcid to her daily regimen. She was given strict return precautions. She was also given an EpiPen if she were to develop shortness of breath or worsening  symptoms.  After history, exam, and medical workup I feel the patient has been appropriately medically screened and is safe for discharge home. Pertinent diagnoses were discussed with the patient. Patient was given return precautions.   I personally performed the services described in this documentation, which was scribed in my presence. The recorded information has been reviewed and is accurate.   Final Clinical Impressions(s) / ED Diagnoses   Final diagnoses:  Allergic reaction, subsequent encounter    New Prescriptions New Prescriptions   DIPHENHYDRAMINE (BENADRYL) 25 MG TABLET    Take 1 tablet (25 mg total) by mouth every 6 (six) hours.   EPINEPHRINE 0.3 MG/0.3 ML IJ SOAJ INJECTION    Inject 0.3 mLs (0.3 mg total) into the muscle once.   FAMOTIDINE (PEPCID) 20 MG TABLET    Take 1 tablet (20 mg total) by mouth 2 (two) times daily.   PREDNISONE (DELTASONE) 10 MG TABLET    Take 50 mg by mouth daily for 3 days, then take 40 mg by mouth daily for 3 days, then take 30 mg by mouth for 3 days, then take 20 mg by mouth for 3 days, then take 10 mg by mouth for 3 days, then take 5 mg by mouth for 3 days     Shon Batonourtney F Canyon Lohr, MD 07/24/16 [phone]

## 2016-07-24 LAB — CBC WITH DIFFERENTIAL/PLATELET
Basophils Absolute: 0.1 10*3/uL (ref 0.0–0.1)
Basophils Relative: 1 %
EOS ABS: 2.2 10*3/uL — AB (ref 0.0–0.7)
EOS PCT: 22 %
HEMATOCRIT: 40.6 % (ref 36.0–46.0)
Hemoglobin: 13 g/dL (ref 12.0–15.0)
Lymphocytes Relative: 27 %
Lymphs Abs: 2.8 10*3/uL (ref 0.7–4.0)
MCH: 24.2 pg — ABNORMAL LOW (ref 26.0–34.0)
MCHC: 32 g/dL (ref 30.0–36.0)
MCV: 75.5 fL — AB (ref 78.0–100.0)
MONO ABS: 1.1 10*3/uL — AB (ref 0.1–1.0)
Monocytes Relative: 11 %
NEUTROS PCT: 39 %
Neutro Abs: 4 10*3/uL (ref 1.7–7.7)
Platelets: 327 10*3/uL (ref 150–400)
RBC: 5.38 MIL/uL — AB (ref 3.87–5.11)
RDW: 19 % — ABNORMAL HIGH (ref 11.5–15.5)
WBC: 10.2 10*3/uL (ref 4.0–10.5)

## 2016-07-24 LAB — BASIC METABOLIC PANEL
ANION GAP: 8 (ref 5–15)
BUN: 12 mg/dL (ref 6–20)
CALCIUM: 8.5 mg/dL — AB (ref 8.9–10.3)
CO2: 24 mmol/L (ref 22–32)
Chloride: 107 mmol/L (ref 101–111)
Creatinine, Ser: 0.92 mg/dL (ref 0.44–1.00)
GFR calc Af Amer: >60 mL/min (ref >60)
GFR calc non Af Amer: >60 mL/min (ref >60)
GLUCOSE: 88 mg/dL (ref 65–99)
Potassium: 3.8 mmol/L (ref 3.5–5.1)
Sodium: 139 mmol/L (ref 135–145)

## 2016-07-24 MED ORDER — DIPHENHYDRAMINE HCL 50 MG/ML IJ SOLN
50.0000 mg | Freq: Once | INTRAMUSCULAR | Status: AC
  Administered 2016-07-24: 50 mg via INTRAMUSCULAR
  Filled 2016-07-24: qty 1

## 2016-07-24 MED ORDER — DIPHENHYDRAMINE HCL 25 MG PO TABS: 25.0000 mg | ORAL_TABLET | Freq: Four times a day (QID) | ORAL | 0 refills | Status: DC

## 2016-07-24 MED ORDER — EPINEPHRINE 0.3 MG/0.3ML IJ SOAJ: 0.3000 mg | Freq: Once | INTRAMUSCULAR | 0 refills | Status: AC

## 2016-07-24 MED ORDER — FAMOTIDINE 20 MG PO TABS: 20.0000 mg | ORAL_TABLET | Freq: Two times a day (BID) | ORAL | 0 refills | Status: DC

## 2016-07-24 MED ORDER — PREDNISONE 10 MG PO TABS: ORAL_TABLET | ORAL | 0 refills | Status: DC

## 2016-07-24 NOTE — Discharge Instructions (Signed)
It is very important that he follow up with your primary physician to set up a specialist appointment with an allergist. If you develop shortness of breath or worsening symptoms you should be reevaluated.

## 2016-07-30 ENCOUNTER — Other Ambulatory Visit: Payer: Self-pay | Admitting: Physician Assistant

## 2016-07-30 DIAGNOSIS — N898 Other specified noninflammatory disorders of vagina: Secondary | ICD-10-CM

## 2016-11-03 ENCOUNTER — Ambulatory Visit: Payer: 59 | Admitting: Allergy

## 2016-11-27 ENCOUNTER — Ambulatory Visit: Payer: 59 | Admitting: Allergy

## 2016-11-28 ENCOUNTER — Emergency Department (HOSPITAL_COMMUNITY)
Admission: EM | Admit: 2016-11-28 | Discharge: 2016-11-29 | Disposition: A | Discharge status: Home / Self Care | Payer: 59 | Attending: Emergency Medicine | Admitting: Emergency Medicine

## 2016-11-28 ENCOUNTER — Encounter (HOSPITAL_COMMUNITY): Payer: Self-pay | Admitting: Emergency Medicine

## 2016-11-28 DIAGNOSIS — Z91010 Allergy to peanuts: Secondary | ICD-10-CM | POA: Diagnosis not present

## 2016-11-28 DIAGNOSIS — Z79899 Other long term (current) drug therapy: Secondary | ICD-10-CM | POA: Diagnosis not present

## 2016-11-28 DIAGNOSIS — N9489 Other specified conditions associated with female genital organs and menstrual cycle: Secondary | ICD-10-CM | POA: Insufficient documentation

## 2016-11-28 DIAGNOSIS — R102 Pelvic and perineal pain: Secondary | ICD-10-CM | POA: Diagnosis present

## 2016-11-28 DIAGNOSIS — N760 Acute vaginitis: Secondary | ICD-10-CM | POA: Insufficient documentation

## 2016-11-28 DIAGNOSIS — B9689 Other specified bacterial agents as the cause of diseases classified elsewhere: Secondary | ICD-10-CM

## 2016-11-28 HISTORY — DX: Solitary cyst of unspecified breast: N60.09

## 2016-11-28 LAB — CBC
HCT: 35.5 % — ABNORMAL LOW (ref 36.0–46.0)
HEMOGLOBIN: 11.7 g/dL — AB (ref 12.0–15.0)
MCH: 24.3 pg — AB (ref 26.0–34.0)
MCHC: 33 g/dL (ref 30.0–36.0)
MCV: 73.7 fL — AB (ref 78.0–100.0)
PLATELETS: 373 10*3/uL (ref 150–400)
RBC: 4.82 MIL/uL (ref 3.87–5.11)
RDW: 17.6 % — ABNORMAL HIGH (ref 11.5–15.5)
WBC: 8.2 10*3/uL (ref 4.0–10.5)

## 2016-11-28 NOTE — ED Triage Notes (Signed)
Pt c/o vaginal pain that began several days; pt had vagal episode in triage in which her BP dropped and pulse increased; pt brought back to treatment room and IV started with fluid bolus; Vitals improved to 93/64 and HR of 97; pt states she has had IUD for 4 years without issue; endorses white discharge;o denies vaginal bleeding; describes pain as mild and "piercing"; pt states she thinks her vagina is swollen

## 2016-11-29 LAB — BASIC METABOLIC PANEL
Anion gap: 7 (ref 5–15)
BUN: 10 mg/dL (ref 6–20)
CHLORIDE: 108 mmol/L (ref 101–111)
CO2: 25 mmol/L (ref 22–32)
CREATININE: 1.13 mg/dL — AB (ref 0.44–1.00)
Calcium: 9.3 mg/dL (ref 8.9–10.3)
GFR calc non Af Amer: >60 mL/min (ref >60)
Glucose, Bld: 94 mg/dL (ref 65–99)
Potassium: 3.3 mmol/L — ABNORMAL LOW (ref 3.5–5.1)
Sodium: 140 mmol/L (ref 135–145)

## 2016-11-29 LAB — URINALYSIS, ROUTINE W REFLEX MICROSCOPIC
BILIRUBIN URINE: NEGATIVE
Glucose, UA: NEGATIVE mg/dL
Hgb urine dipstick: NEGATIVE
Ketones, ur: NEGATIVE mg/dL
Leukocytes, UA: NEGATIVE
NITRITE: NEGATIVE
PH: 7 (ref 5.0–8.0)
Protein, ur: NEGATIVE mg/dL
SPECIFIC GRAVITY, URINE: 1.004 — AB (ref 1.005–1.030)

## 2016-11-29 LAB — WET PREP, GENITAL
Sperm: NONE SEEN
Trich, Wet Prep: NONE SEEN
Yeast Wet Prep HPF POC: NONE SEEN

## 2016-11-29 LAB — RPR: RPR Ser Ql: NONREACTIVE

## 2016-11-29 LAB — HCG, QUANTITATIVE, PREGNANCY: hCG, Beta Chain, Quant, S: <1 m[IU]/mL (ref <5)

## 2016-11-29 LAB — HIV ANTIBODY (ROUTINE TESTING W REFLEX): HIV SCREEN 4TH GENERATION: NONREACTIVE

## 2016-11-29 MED ORDER — POTASSIUM CHLORIDE CRYS ER 20 MEQ PO TBCR
40.0000 meq | EXTENDED_RELEASE_TABLET | Freq: Once | ORAL | Status: AC
  Administered 2016-11-29: 40 meq via ORAL
  Filled 2016-11-29: qty 2

## 2016-11-29 MED ORDER — METRONIDAZOLE 500 MG PO TABS: 500.0000 mg | ORAL_TABLET | Freq: Two times a day (BID) | ORAL | 0 refills | Status: DC

## 2016-11-29 NOTE — Discharge Instructions (Signed)
Take your medication as prescribed until completed.  I recommend following up with a gynecologist within the next week if your symptoms have not improved. Return to the emergency department if symptoms worsen or new onset of fever, abdominal pain, vomiting, vaginal bleeding.  I recommend scheduling an appointment with your primary care provider for follow-up evaluation regarding your abnormal blood work. Your labs performed in the emergency department this evening revealed a mildly decreased potassium of 3.3 and mildly elevated creatinine of 1.13.  You have been tested for HIV, syphilis, chlamydia and gonorrhea.  These results will be available in approximately 3 days.  Please inform all sexual partners if you test positive for any of these diseases.

## 2016-11-29 NOTE — ED Notes (Signed)
PA at bedside.

## 2016-11-29 NOTE — ED Provider Notes (Signed)
[facility]-EMERGENCY DEPT Provider Note  CSN: [id number] Arrival date & time: 11/28/16  2308 By signing my name below, I, Erin Gonzales, attest that this documentation has been prepared under the direction and in the presence of non-physician practitioner, Melburn Hake, PA-C. Electronically Signed: Levon Gonzales, Scribe. 11/29/2016. 1:48 AM.   History   Chief Complaint Chief Complaint  Patient presents with  . Vaginal Pain   HPI Erin Gonzales is a 31 y.o. female who presents to the Emergency Department complaining of intermittent, gradually worsening lower vaginal pain onset a few days ago but worsened significantly tonight. Pt describes the pain as intermittent, stabbing, throbbing vaginal pain that is exacerbated when laying down. No alleviating factors or prior treatments indicated. She notes associated intermittent dysuria and small amount of vaginal d/c.  Pt has had her IUD for 4 years with no issues. Pt has a normal menstrual period and states her last period was 28 days ago. Pt has not been sexually active in 18 months. She denies any concern for STI's. She is not currently followed by an OB-GYN. She denies any vaginal bleeding, chest pain, abdominal pain, nausea, vomiting, constipation or rash. Pt has no other complaints or symptoms at this time. Denies history of abdominal surgeries.  She also had a vagal episode in triage, but states she is back to baseline at this time. Pt reports after she stood up to walk to the triage room, while she was having her BP checked she began to feel lightheaded and nauseous for a few minutes, pt noted to have decreased BP and increased HR by nurse. Pt reports sxs improved once she laid down a few minutes later. Currently denies any sxs.  The history is provided by the patient. No language interpreter was used.   Past Medical History:  Diagnosis Date  . Anxiety   . Breast cyst   . Depression   . Pneumonia    02/2016     Patient Active Problem List   Diagnosis Date Noted  . CONSTIPATION 05/11/2009    Past Surgical History:  Procedure Laterality Date  . BREAST LUMPECTOMY Left 06/18/2016   Procedure: LEFT BREAST LUMPECTOMY;  Surgeon: Abigail Miyamoto, MD;  Location: [facility];  Service: General;  Laterality: Left;  . BREAST LUMPECTOMY     Left  . HERNIA REPAIR     2 months old   . MASTECTOMY    . MASTECTOMY      OB History    No data available      Home Medications    Prior to Admission medications   Medication Sig Start Date End Date Taking? Authorizing Provider  famotidine (PEPCID) 20 MG tablet Take 1 tablet (20 mg total) by mouth 2 (two) times daily. 07/24/16  Yes Shon Baton, MD  levocetirizine (XYZAL) 5 MG tablet Take 5 mg by mouth daily as needed for allergies.    Yes Historical Provider, MD  levonorgestrel (MIRENA) 20 MCG/24HR IUD 1 each by Intrauterine route once.   Yes Historical Provider, MD  diphenhydrAMINE (BENADRYL) 25 MG tablet Take 1 tablet (25 mg total) by mouth every 6 (six) hours. Patient not taking: Reported on 11/28/2016 07/24/16   Shon Baton, MD  hydrOXYzine (ATARAX/VISTARIL) 25 MG tablet Take 1-2 tablets (25-50 mg total) by mouth every 6 (six) hours as needed (for rash/itching; may cause drowsiness). Patient not taking: Reported on 11/28/2016 07/19/16   Paula Libra, MD  methylPREDNISolone (MEDROL DOSEPAK) 4 MG TBPK tablet Take tapering dose per package instructions. May  cause elevated blood sugar. Patient not taking: Reported on 11/28/2016 07/19/16   Paula Libra, MD  metroNIDAZOLE (FLAGYL) 500 MG tablet Take 1 tablet (500 mg total) by mouth 2 (two) times daily. 11/29/16   Barrett Henle, PA-C  predniSONE (DELTASONE) 10 MG tablet Take 50 mg by mouth daily for 3 days, then take 40 mg by mouth daily for 3 days, then take 30 mg by mouth for 3 days, then take 20 mg by mouth for 3 days, then take 10 mg by mouth for 3 days, then take 5 mg by mouth for 3 days Patient not taking: Reported on 11/28/2016  07/24/16   Shon Baton, MD    Family History Family History  Problem Relation Age of Onset  . Hyperlipidemia Mother   . Diabetes Father   . Cancer Maternal Grandmother   . Cancer Paternal Grandmother   . Cancer Paternal Grandfather     Social History Social History  Substance Use Topics  . Smoking status: Never Smoker  . Smokeless tobacco: Never Used  . Alcohol use No     Allergies   Septra [sulfamethoxazole-trimethoprim] and Peanut-containing drug products   Review of Systems Review of Systems  Gastrointestinal: Negative for abdominal pain.  Genitourinary: Positive for dysuria, vaginal discharge and vaginal pain.  All other systems reviewed and are negative.  Physical Exam Updated Vital Signs BP 90/58   Pulse 69   Temp 98.7 F (37.1 C) (Oral)   Resp 18   LMP 10/26/2016 (Approximate)   SpO2 98%   Physical Exam  Constitutional: She is oriented to person, place, and time. She appears well-developed and well-nourished.  HENT:  Head: Normocephalic and atraumatic.  Eyes: Conjunctivae and EOM are normal. Right eye exhibits no discharge. Left eye exhibits no discharge. No scleral icterus.  Cardiovascular: Normal rate, regular rhythm, normal heart sounds and intact distal pulses.   Pulmonary/Chest: Effort normal and breath sounds normal. No respiratory distress. She has no wheezes. She has no rales. She exhibits no tenderness.  Abdominal: Soft. Bowel sounds are normal. She exhibits no distension and no mass. There is no tenderness. There is no rebound and no guarding. No hernia.  Musculoskeletal: She exhibits no edema.  Neurological: She is alert and oriented to person, place, and time.  Skin: Skin is warm and dry.  Nursing note and vitals reviewed.  Pelvic exam: normal external genitalia, vulva, vagina, cervix, uterus and adnexa, VULVA: normal appearing vulva with no masses, tenderness or lesions, VAGINA: normal appearing vagina with normal color and discharge,  no lesions, vaginal discharge - clear, mucoid and scant, CERVIX: normal appearing cervix without discharge or lesions, WET MOUNT done - results: clue cells, white blood cells, DNA probe for chlamydia and GC obtained,  IUD strings present, UTERUS: uterus is normal size, shape, consistency and nontender, ADNEXA: normal adnexa in size, nontender and no masses, exam chaperoned by female nurse.   ED Treatments / Results  DIAGNOSTIC STUDIES:  Oxygen Saturation is 100% on RA, normal by my interpretation.    COORDINATION OF CARE:  1:38 AM Discussed treatment plan with pt at bedside and pt agreed to plan.   Labs (all labs ordered are listed, but only abnormal results are displayed) Labs Reviewed  WET PREP, GENITAL - Abnormal; Notable for the following:       Result Value   Clue Cells Wet Prep HPF POC PRESENT (*)    WBC, Wet Prep HPF POC FEW (*)    All other components within normal  limits  BASIC METABOLIC PANEL - Abnormal; Notable for the following:    Potassium 3.3 (*)    Creatinine, Ser 1.13 (*)    All other components within normal limits  CBC - Abnormal; Notable for the following:    Hemoglobin 11.7 (*)    HCT 35.5 (*)    MCV 73.7 (*)    MCH 24.3 (*)    RDW 17.6 (*)    All other components within normal limits  URINALYSIS, ROUTINE W REFLEX MICROSCOPIC - Abnormal; Notable for the following:    Color, Urine STRAW (*)    Specific Gravity, Urine 1.004 (*)    All other components within normal limits  HCG, QUANTITATIVE, PREGNANCY  RPR  HIV ANTIBODY (ROUTINE TESTING)  GC/CHLAMYDIA PROBE AMP ([place]) NOT AT [facility]    EKG  EKG Interpretation None       Radiology No results found.  Procedures Procedures (including critical care time)  Medications Ordered in ED Medications  potassium chloride SA (K-DUR,KLOR-CON) CR tablet 40 mEq (not administered)     Initial Impression / Assessment and Plan / ED Course  I have reviewed the triage vital signs and the nursing  notes.  Pertinent labs & imaging results that were available during my care of the patient were reviewed by me and considered in my medical decision making (see chart for details).     Patient presents with vaginal pain with associated intermittent dysuria vaginal discharge. Denies currently being sexually active. Denies fever, abdominal pain or vomiting. VSS. Exam revealed no abdominal tenderness. Pelvic exam revealed small amount of clear mucoid discharge in vaginal vault, IUD strings present at cervical os, no CMT or adnexal tenderness. Remaining exam unremarkable. UA unremarkable. Pregnancy negative. Wet prep positive for clue cells and WBCs. K 3.3. Cr 1.13. Suspect patient's symptoms are likely related to bacterial vaginosis. I do not suspect PID or other acute abdominal etiology at this time warranting further workup or imaging. Plan to discharge patient home with Flagyl and advised to follow up with OB/GYN for further evaluation. Advised patient to follow up with her primary care provider regarding her abnormal lab values. Discussed results and plan for discharge with patient. Discussed return precautions.  Final Clinical Impressions(s) / ED Diagnoses   Final diagnoses:  BV (bacterial vaginosis)    New Prescriptions New Prescriptions   METRONIDAZOLE (FLAGYL) 500 MG TABLET    Take 1 tablet (500 mg total) by mouth 2 (two) times daily.   I personally performed the services described in this documentation, which was scribed in my presence. The recorded information has been reviewed and is accurate.    Satira Sarkicole Elizabeth TalalaNadeau, [place] JerseyPA-C 11/29/16 [phone]    Dione Boozeavid Glick, MD 11/29/16 [phone]

## 2016-12-01 LAB — GC/CHLAMYDIA PROBE AMP (~~LOC~~) NOT AT ARMC
Chlamydia: NEGATIVE
Neisseria Gonorrhea: NEGATIVE

## 2017-01-02 ENCOUNTER — Ambulatory Visit: Payer: 59 | Admitting: Allergy

## 2017-01-30 ENCOUNTER — Ambulatory Visit: Payer: 59 | Admitting: Allergy

## 2017-02-19 ENCOUNTER — Other Ambulatory Visit: Payer: Self-pay | Admitting: Surgery

## 2017-02-19 DIAGNOSIS — N611 Abscess of the breast and nipple: Secondary | ICD-10-CM

## 2017-03-10 ENCOUNTER — Other Ambulatory Visit: Payer: 59

## 2017-03-10 IMAGING — CR DG CHEST 2V
2 series · 2 of 2 positions shown · non-contrast
Comparison: None.

CLINICAL DATA: Cough and chills

EXAM:
CHEST  2 VIEW

[PA]
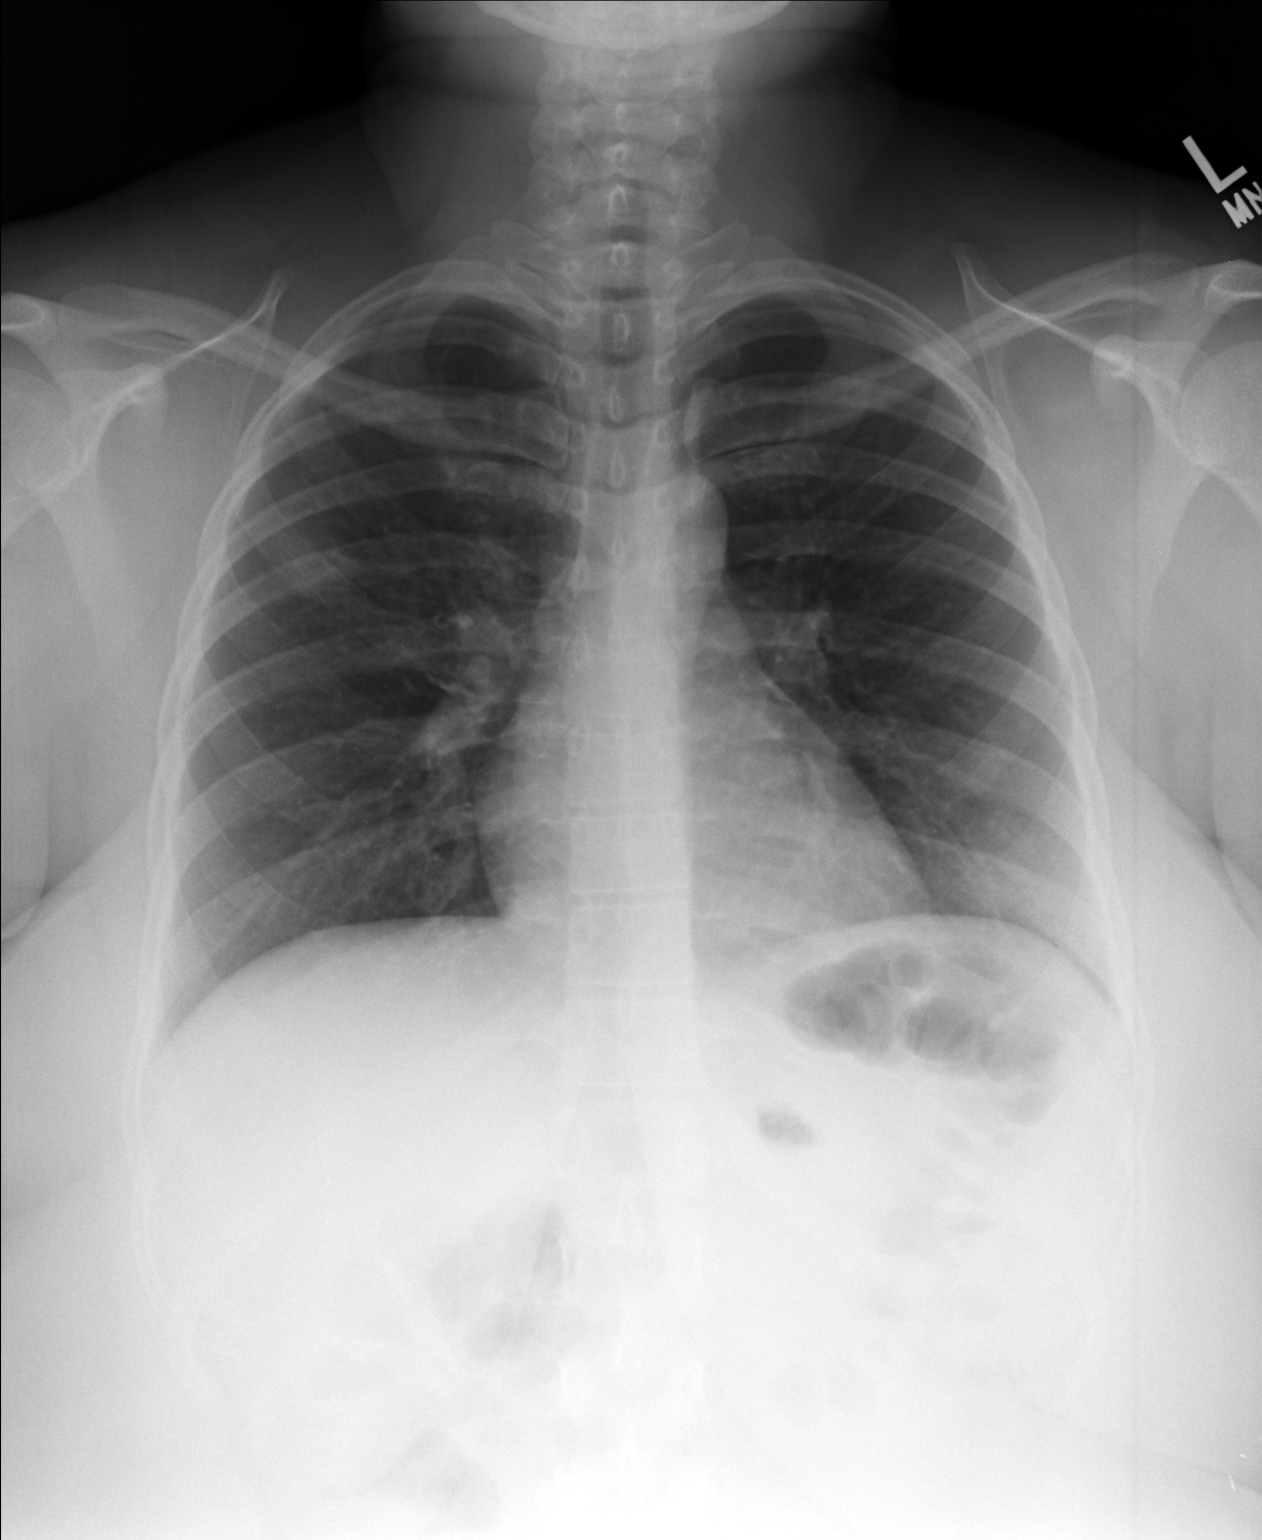

[lateral]
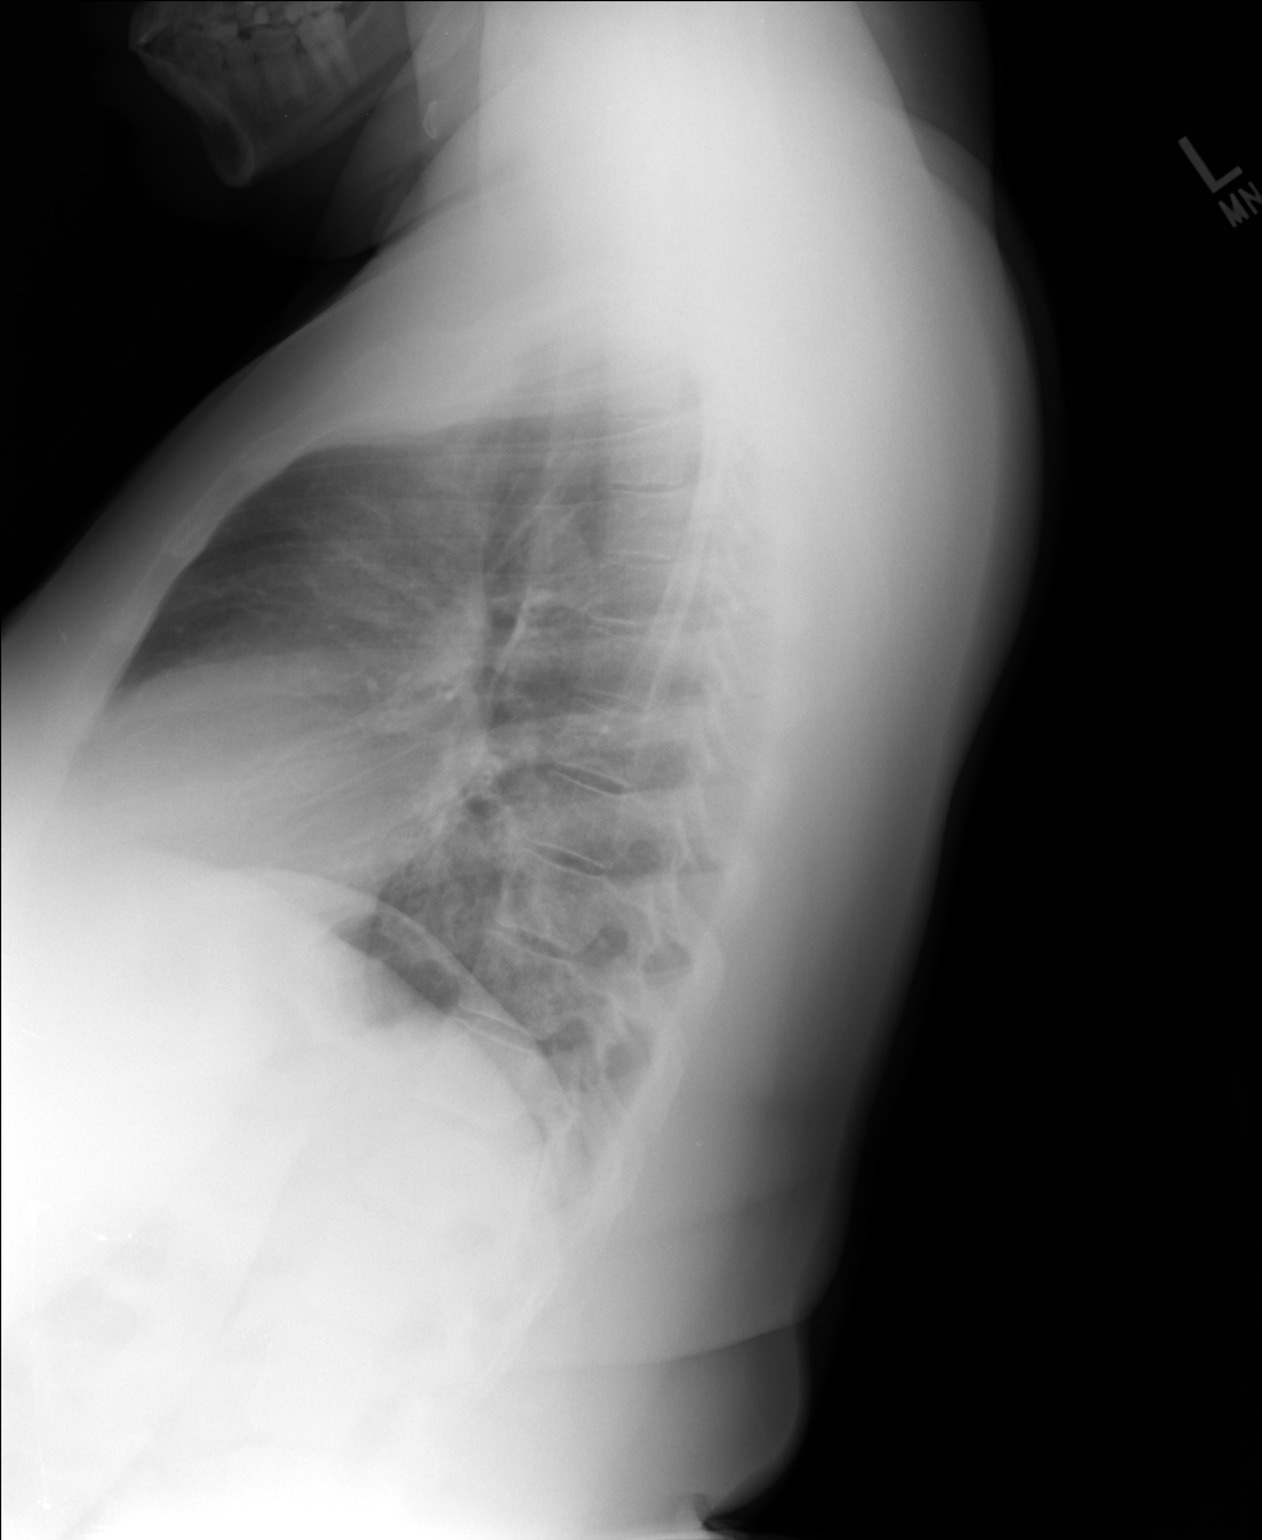

[2 of 2 positions shown; findings below may reference images not displayed]

FINDINGS: Cardiomediastinal silhouette is stable. No pulmonary edema. Subtle
streaky right upper lobe and left base retrocardiac atelectasis or
early infiltrate. Bony thorax is unremarkable.
IMPRESSION: No pulmonary edema. Subtle streaky right upper and left base
retrocardiac lobe atelectasis or early infiltrate.

## 2017-03-19 ENCOUNTER — Ambulatory Visit: Payer: 59 | Admitting: Allergy

## 2017-04-01 ENCOUNTER — Encounter: Payer: 59 | Admitting: Obstetrics & Gynecology

## 2017-04-20 ENCOUNTER — Encounter (HOSPITAL_BASED_OUTPATIENT_CLINIC_OR_DEPARTMENT_OTHER): Payer: 59 | Attending: Internal Medicine

## 2017-04-20 ENCOUNTER — Other Ambulatory Visit (HOSPITAL_COMMUNITY)
Admission: RE | Admit: 2017-04-20 | Discharge: 2017-04-20 | Disposition: A | Discharge status: Home / Self Care | Payer: 59 | Source: Other Acute Inpatient Hospital | Attending: Internal Medicine | Admitting: Internal Medicine

## 2017-04-20 DIAGNOSIS — N611 Abscess of the breast and nipple: Secondary | ICD-10-CM | POA: Diagnosis present

## 2017-04-20 DIAGNOSIS — Y838 Other surgical procedures as the cause of abnormal reaction of the patient, or of later complication, without mention of misadventure at the time of the procedure: Secondary | ICD-10-CM | POA: Diagnosis not present

## 2017-04-20 DIAGNOSIS — L98498 Non-pressure chronic ulcer of skin of other sites with other specified severity: Secondary | ICD-10-CM | POA: Insufficient documentation

## 2017-04-20 DIAGNOSIS — T8131XA Disruption of external operation (surgical) wound, not elsewhere classified, initial encounter: Secondary | ICD-10-CM | POA: Insufficient documentation

## 2017-04-23 LAB — AEROBIC CULTURE  (SUPERFICIAL SPECIMEN)

## 2017-04-23 LAB — AEROBIC CULTURE W GRAM STAIN (SUPERFICIAL SPECIMEN)

## 2017-04-27 DIAGNOSIS — T8131XA Disruption of external operation (surgical) wound, not elsewhere classified, initial encounter: Secondary | ICD-10-CM | POA: Diagnosis not present

## 2017-04-30 ENCOUNTER — Ambulatory Visit (INDEPENDENT_AMBULATORY_CARE_PROVIDER_SITE_OTHER): Payer: 59 | Admitting: Obstetrics and Gynecology

## 2017-04-30 ENCOUNTER — Encounter: Payer: Self-pay | Admitting: Family Medicine

## 2017-04-30 ENCOUNTER — Encounter: Payer: Self-pay | Admitting: Obstetrics and Gynecology

## 2017-04-30 VITALS — BP 115/66 | HR 86 | Ht 59.0 in | Wt 182.1 lb

## 2017-04-30 DIAGNOSIS — Z30432 Encounter for removal of intrauterine contraceptive device: Secondary | ICD-10-CM | POA: Diagnosis not present

## 2017-04-30 NOTE — Procedures (Signed)
Intrauterine Device (IUD) Removal Procedure Note  Referred by PCP for IUD removal. Patient states she has Mirena and it's been in for a little over four years and using it for period control and BC. Patient would like it removed due to AUB and believes it is causing recurrent BV and PCP doesn't have equipment to remove IUD, and that her pap is UTD as well as STD swab testing, per patient.   Prior to the procedure being performed, the patient (or guardian) was asked to state their full name, date of birth, and the type of procedure being performed. EGBUS normal. Vaginal vault normal with minimal old blood in vault. Cervix normal with IUD strings seen (approx 4cm in length). Strings grasped with ringed forceps and easily removed and noted to be intact.   No complications, patient tolerated the procedure well.  Patient states she has appointment with PCP next week and [facility] at that time.  Cornelia Copaharlie Wong Steadham, Jr MD Attending Center for [facility](Faculty Practice)

## 2017-04-30 NOTE — Progress Notes (Signed)
GYN Note See procedure note

## 2017-05-11 DIAGNOSIS — T8131XA Disruption of external operation (surgical) wound, not elsewhere classified, initial encounter: Secondary | ICD-10-CM | POA: Diagnosis not present

## 2017-05-14 ENCOUNTER — Other Ambulatory Visit: Payer: Self-pay | Admitting: Physician Assistant

## 2017-05-20 ENCOUNTER — Other Ambulatory Visit: Payer: 59

## 2017-05-25 ENCOUNTER — Encounter (HOSPITAL_BASED_OUTPATIENT_CLINIC_OR_DEPARTMENT_OTHER): Payer: 59

## 2017-06-18 ENCOUNTER — Other Ambulatory Visit: Payer: Self-pay | Admitting: Surgery

## 2018-03-15 NOTE — H&P (Signed)
Subjective:     Patient ID: Erin Gonzales is a 32 y.o. female.  HPI  Erin Gonzales is a 32 yo female here with her mom for pre operative history and physical prior to bilateral breast reduction.  Seen a couple of times last year for evaluation/discussion for breast reduction.  Current 34 JJ and patient reports several year history neck and back pain. She Has tried OTC pain medication, heat pads without relief for pain. She has been specialty fitted for bra without change in discomfort. No problems with rashes beneath breasts that she uses OTC powder for control. She has had chronic wound from left breast post lumpectomy and [facility] She reports that the left breast wound will occasionally heal over but then re-open and drain. She has a small open area that is draining a small amount of pink drainage today.   PMH significant for firmness left breast and MMG with multiple masses, biopsy of these abscess. Underwent lumepctomy 06/2016, pathology benign. As part of treatment of this given Bactrim and developed allergic reaction to it.   Wt - notes up 30 lb over last 3-4 years since Mirena placed. States breasts grew from GG to current JJ over that period. Desires "DD."  No family hx breast or ovarian ca.  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 Lives alone. Notes she would like to have kids over next few years.  PastMedicalHistory      Past Medical History:  Diagnosis Date  . Anxiety   . Depression      PastSurgicalHistory  Past Surgical History:  Procedure Laterality Date  . BREAST LUMPECTOMY Left 06/18/2016   benign, for chronic abscess  . HERNIA REPAIR     2 months old          Allergies  Allergen Reactions  . Sulfamethoxazole-Trimethoprim Rash (ALLERGY/intolerance)  . Peanut Swelling (ALLERGY/intolerance)    Swelling of tongue/lips    Current Outpatient Prescriptions:  .  escitalopram oxalate (LEXAPRO) 5 MG  tablet, Take by mouth., Disp: , Rfl:  .  levocetirizine (XYZAL) 5 MG tablet, Take by mouth., Disp: , Rfl:    The following portions of the patient's history were reviewed and updated as appropriate: allergies, current medications, past family history, past medical history, past social history, past surgical history and problem list.  Review of Systems  Constitutional: Negative.   HENT: Negative.   Eyes: Negative.   Respiratory: Negative.        Had Pneumonia last year, but recovered without problems  Cardiovascular: Negative.   Gastrointestinal: Negative.   Endocrine: Negative.   Genitourinary: Negative.   Musculoskeletal: Positive for back pain, myalgias, neck pain and neck stiffness.  Skin: Positive for wound.       Left breast with chronic intermittently drainage about the NAC inferiorly.   Allergic/Immunologic: Negative.   Neurological: Negative.   Hematological: Negative.   Psychiatric/Behavioral: Positive for dysphoric mood.       History of depression well controlled on Lexapro       Objective:   Physical Exam  Constitutional: She is oriented to person, place, and time. She appears well-developed and well-nourished. No distress.  HENT:  Head: Normocephalic and atraumatic.  Nose: Nose normal.  Mouth/Throat: Oropharynx is clear and moist.  Eyes: Pupils are equal, round, and reactive to light. Conjunctivae and EOM are normal.  Neck: Normal range of motion. Neck supple. No JVD present. No tracheal deviation present. No thyromegaly present.  Cardiovascular: Normal rate, regular  rhythm and intact distal pulses.  Exam reveals no gallop and no friction rub.   No murmur heard. Pulmonary/Chest: Effort normal and breath sounds normal. No stridor. No respiratory distress. She has no wheezes.  Breasts:  Right>>left volume, grade 3 ptosis bilat SN to nipple R 41.5 cm L 38cm BW R 21 L 21 cm Nipple to IMF R 19 L 15 cm Inferior NAC with small open area draining pink drainage,   No cellulitis appreciated.   Abdominal: Soft. Bowel sounds are normal. She exhibits no distension. There is no tenderness.  Musculoskeletal: Normal range of motion. She exhibits no edema, tenderness or deformity.  Neurological: She is alert and oriented to person, place, and time. No cranial nerve deficit. She exhibits normal muscle tone. Coordination normal.  Skin: Skin is warm. No rash noted. No erythema.  Psychiatric: She has a normal mood and affect. Her behavior is normal. Judgment and thought content normal.       Assessment:     Macromastia Chronic neck and back pain S/p left lumpectomy for chronic abscesses     Plan:     Plan bilateral breast reduction. Anticipate 482  g reduction from each breast. Discussed that the left breast may continue to have wound healing issues given her history and that we will not know this until the surgery has been completed.   The risks that can be encountered with and after a breast reduction were discussed and include the following but not limited to these: breast asymmetry, fluid accumulation, firmness of the breast, inability to breast feed, loss of nipple or areola, skin loss, decrease or loss in nipple sensation, fat necrosis with death of fat tissue, bleeding, infection, delayed healing, anesthesia risks, skin sensation changes, injury to structures including nerves, blood vessels, and muscles which may be temporary or permanent, allergies to tape, suture materials and glues, blood products, topical preparations or injected agents, skin and breast contour irregularities, skin discoloration and swelling, deep vein thrombosis, cardiac and pulmonary complications, pain, which may persist, persistent pain, recurrence of the lesion, poor healing of the incision, possible need for revisional surgery or staged procedures. A breast reduction can also interfere with certain diagnostic procedures.  Breast and nipple piercing can cause an infection.  This  procedure can be performed at any age, but is best done when your breasts are fully developed. Changes in the breasts during pregnancy can alter the outcomes of previous breast reduction surgery, as can significant weight fluctuations. The patient's and mom's questions were answered and she desires to proceed and consent was obtained.   Prescriptions for Norco, Keflex and Phenergan given this visit.      Glenna FellowsBrinda Carlinda Ohlson, MD [facility] Plastic & Reconstructive Surgery [phone], pin [phone]

## 2018-03-16 ENCOUNTER — Encounter (HOSPITAL_BASED_OUTPATIENT_CLINIC_OR_DEPARTMENT_OTHER): Payer: Self-pay | Admitting: *Deleted

## 2018-03-16 ENCOUNTER — Other Ambulatory Visit: Payer: Self-pay

## 2018-03-16 NOTE — Progress Notes (Signed)
Bring all medications. Pt coming tomorrow to pick up Ensure.

## 2018-03-22 ENCOUNTER — Encounter (HOSPITAL_BASED_OUTPATIENT_CLINIC_OR_DEPARTMENT_OTHER): Payer: Self-pay | Admitting: Anesthesiology

## 2018-03-22 NOTE — Anesthesia Preprocedure Evaluation (Addendum)
Anesthesia Evaluation  Patient identified by MRN, date of birth, ID band Patient awake    Reviewed: Allergy & Precautions, NPO status , Patient's Chart, lab work & pertinent test results  Airway Mallampati: II  TM Distance: >3 FB Neck ROM: Full    Dental  (+) Teeth Intact, Dental Advisory Given   Pulmonary    breath sounds clear to auscultation       Cardiovascular negative cardio ROS   Rhythm:Regular Rate:Normal     Neuro/Psych PSYCHIATRIC DISORDERS Anxiety Depression    GI/Hepatic negative GI ROS, Neg liver ROS,   Endo/Other  negative endocrine ROS  Renal/GU negative Renal ROS     Musculoskeletal negative musculoskeletal ROS (+)   Abdominal Normal abdominal exam  (+)   Peds  Hematology negative hematology ROS (+)   Anesthesia Other Findings   Reproductive/Obstetrics                            Anesthesia Physical Anesthesia Plan  ASA: II  Anesthesia Plan: General   Post-op Pain Management:    Induction: Intravenous  PONV Risk Score and Plan: 4 or greater and Ondansetron, Dexamethasone, Midazolam and Scopolamine patch - Pre-op  Airway Management Planned: Oral ETT  Additional Equipment: None  Intra-op Plan:   Post-operative Plan: Extubation in OR  Informed Consent: I have reviewed the patients History and Physical, chart, labs and discussed the procedure including the risks, benefits and alternatives for the proposed anesthesia with the patient or authorized representative who has indicated his/her understanding and acceptance.   Dental advisory given  Plan Discussed with: CRNA  Anesthesia Plan Comments:        Anesthesia Quick Evaluation

## 2018-03-22 NOTE — Progress Notes (Signed)
NPO am of surg except ensure presurg by 0410. Pt verbalized understanding

## 2018-03-23 ENCOUNTER — Ambulatory Visit (HOSPITAL_BASED_OUTPATIENT_CLINIC_OR_DEPARTMENT_OTHER): Payer: 59 | Admitting: Anesthesiology

## 2018-03-23 ENCOUNTER — Ambulatory Visit (HOSPITAL_BASED_OUTPATIENT_CLINIC_OR_DEPARTMENT_OTHER)
Admission: RE | Admit: 2018-03-23 | Discharge: 2018-03-23 | Disposition: A | Discharge status: Home / Self Care | Payer: 59 | Source: Ambulatory Visit | Attending: Plastic Surgery | Admitting: Plastic Surgery

## 2018-03-23 ENCOUNTER — Encounter (HOSPITAL_BASED_OUTPATIENT_CLINIC_OR_DEPARTMENT_OTHER)
Admission: RE | Disposition: A | Discharge status: Home / Self Care | Payer: Self-pay | Source: Ambulatory Visit | Attending: Plastic Surgery

## 2018-03-23 ENCOUNTER — Encounter (HOSPITAL_BASED_OUTPATIENT_CLINIC_OR_DEPARTMENT_OTHER): Payer: Self-pay | Admitting: Anesthesiology

## 2018-03-23 ENCOUNTER — Other Ambulatory Visit: Payer: Self-pay

## 2018-03-23 DIAGNOSIS — M549 Dorsalgia, unspecified: Secondary | ICD-10-CM | POA: Diagnosis not present

## 2018-03-23 DIAGNOSIS — F329 Major depressive disorder, single episode, unspecified: Secondary | ICD-10-CM | POA: Insufficient documentation

## 2018-03-23 DIAGNOSIS — N62 Hypertrophy of breast: Secondary | ICD-10-CM | POA: Insufficient documentation

## 2018-03-23 DIAGNOSIS — F419 Anxiety disorder, unspecified: Secondary | ICD-10-CM | POA: Diagnosis not present

## 2018-03-23 DIAGNOSIS — M542 Cervicalgia: Secondary | ICD-10-CM | POA: Diagnosis not present

## 2018-03-23 DIAGNOSIS — Z79899 Other long term (current) drug therapy: Secondary | ICD-10-CM | POA: Insufficient documentation

## 2018-03-23 HISTORY — PX: BREAST REDUCTION SURGERY: SHX8

## 2018-03-23 SURGERY — MAMMOPLASTY, REDUCTION
Anesthesia: General | Site: Breast | Laterality: Bilateral

## 2018-03-23 MED ORDER — MIDAZOLAM HCL 2 MG/2ML IJ SOLN
1.0000 mg | INTRAMUSCULAR | Status: DC | PRN
  Administered 2018-03-23: 2 mg via INTRAVENOUS

## 2018-03-23 MED ORDER — CELECOXIB 200 MG PO CAPS
ORAL_CAPSULE | ORAL | Status: AC
Start: 2018-03-23 — End: ?
  Filled 2018-03-23: qty 1

## 2018-03-23 MED ORDER — HEPARIN SODIUM (PORCINE) 5000 UNIT/ML IJ SOLN
5000.0000 [IU] | Freq: Once | INTRAMUSCULAR | Status: AC
  Administered 2018-03-23: 5000 [IU] via SUBCUTANEOUS

## 2018-03-23 MED ORDER — ROCURONIUM BROMIDE 100 MG/10ML IV SOLN
INTRAVENOUS | Status: DC | PRN
Start: 2018-03-23 — End: 2018-03-23
  Administered 2018-03-23: 50 mg via INTRAVENOUS
  Administered 2018-03-23: 20 mg via INTRAVENOUS

## 2018-03-23 MED ORDER — OXYCODONE HCL 5 MG/5ML PO SOLN: 5.0000 mg | Freq: Once | ORAL | Status: DC | PRN

## 2018-03-23 MED ORDER — PROMETHAZINE HCL 25 MG/ML IJ SOLN
6.2500 mg | INTRAMUSCULAR | Status: DC | PRN
  Administered 2018-03-23: 6.25 mg via INTRAVENOUS

## 2018-03-23 MED ORDER — BUPIVACAINE LIPOSOME 1.3 % IJ SUSP
INTRAMUSCULAR | Status: DC | PRN
  Administered 2018-03-23: 40 mL

## 2018-03-23 MED ORDER — BUPIVACAINE LIPOSOME 1.3 % IJ SUSP
INTRAMUSCULAR | Status: AC
  Filled 2018-03-23: qty 20

## 2018-03-23 MED ORDER — ACETAMINOPHEN 500 MG PO TABS
ORAL_TABLET | ORAL | Status: AC
  Filled 2018-03-23: qty 2

## 2018-03-23 MED ORDER — FENTANYL CITRATE (PF) 100 MCG/2ML IJ SOLN
INTRAMUSCULAR | Status: AC
  Filled 2018-03-23: qty 2

## 2018-03-23 MED ORDER — DEXAMETHASONE SODIUM PHOSPHATE 10 MG/ML IJ SOLN
INTRAMUSCULAR | Status: AC
  Filled 2018-03-23: qty 1

## 2018-03-23 MED ORDER — LIDOCAINE HCL (CARDIAC) PF 100 MG/5ML IV SOSY
PREFILLED_SYRINGE | INTRAVENOUS | Status: AC
  Filled 2018-03-23: qty 5

## 2018-03-23 MED ORDER — CHLORHEXIDINE GLUCONATE CLOTH 2 % EX PADS: 6.0000 | MEDICATED_PAD | Freq: Once | CUTANEOUS | Status: DC

## 2018-03-23 MED ORDER — SUCCINYLCHOLINE CHLORIDE 200 MG/10ML IV SOSY
PREFILLED_SYRINGE | INTRAVENOUS | Status: AC
  Filled 2018-03-23: qty 10

## 2018-03-23 MED ORDER — LIDOCAINE HCL (CARDIAC) PF 100 MG/5ML IV SOSY
PREFILLED_SYRINGE | INTRAVENOUS | Status: DC | PRN
  Administered 2018-03-23: 50 mg via INTRAVENOUS

## 2018-03-23 MED ORDER — OXYCODONE HCL 5 MG PO TABS: 5.0000 mg | ORAL_TABLET | Freq: Once | ORAL | Status: DC | PRN

## 2018-03-23 MED ORDER — CEFAZOLIN SODIUM-DEXTROSE 2-4 GM/100ML-% IV SOLN
INTRAVENOUS | Status: AC
  Filled 2018-03-23: qty 100

## 2018-03-23 MED ORDER — DEXAMETHASONE SODIUM PHOSPHATE 4 MG/ML IJ SOLN
INTRAMUSCULAR | Status: DC | PRN
  Administered 2018-03-23: 10 mg via INTRAVENOUS

## 2018-03-23 MED ORDER — PROMETHAZINE HCL 25 MG/ML IJ SOLN
INTRAMUSCULAR | Status: AC
  Filled 2018-03-23: qty 1

## 2018-03-23 MED ORDER — SUGAMMADEX SODIUM 200 MG/2ML IV SOLN
INTRAVENOUS | Status: DC | PRN
  Administered 2018-03-23: 200 mg via INTRAVENOUS

## 2018-03-23 MED ORDER — 0.9 % SODIUM CHLORIDE (POUR BTL) OPTIME
TOPICAL | Status: DC | PRN
  Administered 2018-03-23: 1000 mL

## 2018-03-23 MED ORDER — SODIUM CHLORIDE 0.9 % IJ SOLN
INTRAMUSCULAR | Status: AC
  Filled 2018-03-23: qty 20

## 2018-03-23 MED ORDER — GABAPENTIN 300 MG PO CAPS
ORAL_CAPSULE | ORAL | Status: AC
  Filled 2018-03-23: qty 1

## 2018-03-23 MED ORDER — PROPOFOL 10 MG/ML IV BOLUS
INTRAVENOUS | Status: DC | PRN
  Administered 2018-03-23: 150 mg via INTRAVENOUS

## 2018-03-23 MED ORDER — ROCURONIUM BROMIDE 10 MG/ML (PF) SYRINGE
PREFILLED_SYRINGE | INTRAVENOUS | Status: AC
  Filled 2018-03-23: qty 10

## 2018-03-23 MED ORDER — SCOPOLAMINE 1 MG/3DAYS TD PT72
1.0000 | MEDICATED_PATCH | Freq: Once | TRANSDERMAL | Status: DC | PRN
  Administered 2018-03-23: 1.5 mg via TRANSDERMAL

## 2018-03-23 MED ORDER — ONDANSETRON HCL 4 MG/2ML IJ SOLN
INTRAMUSCULAR | Status: DC | PRN
  Administered 2018-03-23: 4 mg via INTRAVENOUS

## 2018-03-23 MED ORDER — MEPERIDINE HCL 25 MG/ML IJ SOLN: 6.2500 mg | INTRAMUSCULAR | Status: DC | PRN

## 2018-03-23 MED ORDER — ONDANSETRON HCL 4 MG/2ML IJ SOLN
INTRAMUSCULAR | Status: AC
  Filled 2018-03-23: qty 2

## 2018-03-23 MED ORDER — FENTANYL CITRATE (PF) 100 MCG/2ML IJ SOLN
50.0000 ug | INTRAMUSCULAR | Status: AC | PRN
  Administered 2018-03-23 (×2): 50 ug via INTRAVENOUS
  Administered 2018-03-23: 100 ug via INTRAVENOUS
  Administered 2018-03-23: 50 ug via INTRAVENOUS

## 2018-03-23 MED ORDER — LACTATED RINGERS IV SOLN
INTRAVENOUS | Status: DC
  Administered 2018-03-23 (×2): via INTRAVENOUS

## 2018-03-23 MED ORDER — CEFAZOLIN SODIUM-DEXTROSE 2-4 GM/100ML-% IV SOLN
2.0000 g | INTRAVENOUS | Status: AC
  Administered 2018-03-23: 2 g via INTRAVENOUS

## 2018-03-23 MED ORDER — HYDROMORPHONE HCL 1 MG/ML IJ SOLN
0.2500 mg | INTRAMUSCULAR | Status: DC | PRN
  Administered 2018-03-23: 0.5 mg via INTRAVENOUS

## 2018-03-23 MED ORDER — GABAPENTIN 300 MG PO CAPS
300.0000 mg | ORAL_CAPSULE | ORAL | Status: AC
  Administered 2018-03-23: 300 mg via ORAL

## 2018-03-23 MED ORDER — SUGAMMADEX SODIUM 200 MG/2ML IV SOLN
INTRAVENOUS | Status: AC
  Filled 2018-03-23: qty 2

## 2018-03-23 MED ORDER — HEPARIN SODIUM (PORCINE) 5000 UNIT/ML IJ SOLN
INTRAMUSCULAR | Status: AC
  Filled 2018-03-23: qty 1

## 2018-03-23 MED ORDER — LACTATED RINGERS IV SOLN: INTRAVENOUS | Status: DC

## 2018-03-23 MED ORDER — PHENYLEPHRINE 40 MCG/ML (10ML) SYRINGE FOR IV PUSH (FOR BLOOD PRESSURE SUPPORT)
PREFILLED_SYRINGE | INTRAVENOUS | Status: AC
  Filled 2018-03-23: qty 10

## 2018-03-23 MED ORDER — HYDROMORPHONE HCL 1 MG/ML IJ SOLN
INTRAMUSCULAR | Status: AC
  Filled 2018-03-23: qty 0.5

## 2018-03-23 MED ORDER — SCOPOLAMINE 1 MG/3DAYS TD PT72
MEDICATED_PATCH | TRANSDERMAL | Status: AC
  Filled 2018-03-23: qty 1

## 2018-03-23 MED ORDER — EPHEDRINE SULFATE 50 MG/ML IJ SOLN
INTRAMUSCULAR | Status: AC
  Filled 2018-03-23: qty 1

## 2018-03-23 MED ORDER — ACETAMINOPHEN 500 MG PO TABS
1000.0000 mg | ORAL_TABLET | ORAL | Status: AC
  Administered 2018-03-23: 1000 mg via ORAL

## 2018-03-23 MED ORDER — CELECOXIB 200 MG PO CAPS
200.0000 mg | ORAL_CAPSULE | ORAL | Status: AC
  Administered 2018-03-23: 200 mg via ORAL

## 2018-03-23 MED ORDER — MIDAZOLAM HCL 2 MG/2ML IJ SOLN
INTRAMUSCULAR | Status: AC
  Filled 2018-03-23: qty 2

## 2018-03-23 SURGICAL SUPPLY — 60 items
ADH SKN CLS APL DERMABOND .7 (GAUZE/BANDAGES/DRESSINGS) ×2
APPLIER CLIP 9.375 MED OPEN (MISCELLANEOUS)
APR CLP MED 9.3 20 MLT OPN (MISCELLANEOUS)
BINDER BREAST 3XL (GAUZE/BANDAGES/DRESSINGS) IMPLANT
BINDER BREAST LRG (GAUZE/BANDAGES/DRESSINGS) IMPLANT
BINDER BREAST MEDIUM (GAUZE/BANDAGES/DRESSINGS) IMPLANT
BINDER BREAST XLRG (GAUZE/BANDAGES/DRESSINGS) IMPLANT
BINDER BREAST XXLRG (GAUZE/BANDAGES/DRESSINGS) ×1 IMPLANT
BLADE SURG 10 STRL SS (BLADE) ×8 IMPLANT
BNDG GAUZE ELAST 4 BULKY (GAUZE/BANDAGES/DRESSINGS) ×4 IMPLANT
CANISTER SUCT 1200ML W/VALVE (MISCELLANEOUS) ×2 IMPLANT
CHLORAPREP W/TINT 26ML (MISCELLANEOUS) ×2 IMPLANT
CLIP APPLIE 9.375 MED OPEN (MISCELLANEOUS) IMPLANT
CLIP VESOCCLUDE MED 6/CT (CLIP) IMPLANT
COVER BACK TABLE 60X90IN (DRAPES) ×2 IMPLANT
COVER MAYO STAND STRL (DRAPES) ×2 IMPLANT
DERMABOND ADVANCED (GAUZE/BANDAGES/DRESSINGS) ×2
DERMABOND ADVANCED .7 DNX12 (GAUZE/BANDAGES/DRESSINGS) ×1 IMPLANT
DRAIN CHANNEL 15F RND FF W/TCR (WOUND CARE) ×2 IMPLANT
DRAPE TOP ARMCOVERS (MISCELLANEOUS) ×2 IMPLANT
DRAPE U-SHAPE 76X120 STRL (DRAPES) ×2 IMPLANT
DRSG PAD ABDOMINAL 8X10 ST (GAUZE/BANDAGES/DRESSINGS) ×4 IMPLANT
ELECT COATED BLADE 2.86 ST (ELECTRODE) ×2 IMPLANT
ELECT REM PT RETURN 9FT ADLT (ELECTROSURGICAL) ×2
ELECTRODE REM PT RTRN 9FT ADLT (ELECTROSURGICAL) ×1 IMPLANT
EVACUATOR SILICONE 100CC (DRAIN) ×2 IMPLANT
GLOVE BIO SURGEON STRL SZ 6 (GLOVE) ×5 IMPLANT
GLOVE BIOGEL PI IND STRL 6.5 (GLOVE) IMPLANT
GLOVE BIOGEL PI IND STRL 7.0 (GLOVE) IMPLANT
GLOVE BIOGEL PI INDICATOR 6.5 (GLOVE) ×2
GLOVE BIOGEL PI INDICATOR 7.0 (GLOVE) ×2
GLOVE ECLIPSE 7.0 STRL STRAW (GLOVE) ×1 IMPLANT
GLOVE SURG SS PI 6.5 STRL IVOR (GLOVE) ×2 IMPLANT
GOWN STRL REUS W/ TWL LRG LVL3 (GOWN DISPOSABLE) ×2 IMPLANT
GOWN STRL REUS W/TWL LRG LVL3 (GOWN DISPOSABLE) ×8
MARKER SKIN DUAL TIP RULER LAB (MISCELLANEOUS) IMPLANT
NDL HYPO 25X1 1.5 SAFETY (NEEDLE) IMPLANT
NEEDLE HYPO 25X1 1.5 SAFETY (NEEDLE) ×2 IMPLANT
NS IRRIG 1000ML POUR BTL (IV SOLUTION) ×2 IMPLANT
PACK BASIN DAY SURGERY FS (CUSTOM PROCEDURE TRAY) ×2 IMPLANT
PENCIL BUTTON HOLSTER BLD 10FT (ELECTRODE) ×2 IMPLANT
PIN SAFETY STERILE (MISCELLANEOUS) ×2 IMPLANT
SHEET MEDIUM DRAPE 40X70 STRL (DRAPES) ×2 IMPLANT
SLEEVE SCD COMPRESS KNEE MED (MISCELLANEOUS) ×2 IMPLANT
SPONGE LAP 18X18 RF (DISPOSABLE) ×8 IMPLANT
STAPLER VISISTAT 35W (STAPLE) ×4 IMPLANT
SUT ETHILON 2 0 FS 18 (SUTURE) ×3 IMPLANT
SUT MNCRL AB 4-0 PS2 18 (SUTURE) ×8 IMPLANT
SUT PDS AB 2-0 CT2 27 (SUTURE) IMPLANT
SUT VIC AB 3-0 PS1 18 (SUTURE) ×12
SUT VIC AB 3-0 PS1 18XBRD (SUTURE) ×4 IMPLANT
SUT VICRYL 4-0 PS2 18IN ABS (SUTURE) ×4 IMPLANT
SYR BULB IRRIGATION 50ML (SYRINGE) ×2 IMPLANT
SYR CONTROL 10ML LL (SYRINGE) ×1 IMPLANT
TAPE MEASURE VINYL STERILE (MISCELLANEOUS) IMPLANT
TOWEL GREEN STERILE FF (TOWEL DISPOSABLE) ×4 IMPLANT
TRAY FOLEY W/BAG SLVR 14FR LF (SET/KITS/TRAYS/PACK) IMPLANT
TUBE CONNECTING 20X1/4 (TUBING) ×2 IMPLANT
UNDERPAD 30X30 (UNDERPADS AND DIAPERS) ×4 IMPLANT
YANKAUER SUCT BULB TIP NO VENT (SUCTIONS) ×2 IMPLANT

## 2018-03-23 NOTE — Anesthesia Postprocedure Evaluation (Signed)
Anesthesia Post Note  Patient: Erin Gonzales  Procedure(s) Performed: MAMMARY REDUCTION  (BREAST) (Bilateral Breast)     Patient location during evaluation: PACU Anesthesia Type: General Level of consciousness: awake and alert Pain management: pain level controlled Vital Signs Assessment: post-procedure vital signs reviewed and stable Respiratory status: spontaneous breathing, nonlabored ventilation, respiratory function stable and patient connected to nasal cannula oxygen Cardiovascular status: blood pressure returned to baseline and stable Postop Assessment: no apparent nausea or vomiting Anesthetic complications: no    Last Vitals:  Vitals:   03/23/18 1230 03/23/18 1245  BP: 97/63 (!) 90/57  Pulse: 81 79  Resp: 15 12  Temp:    SpO2: 98% 98%    Last Pain:  Vitals:   03/23/18 1245  TempSrc:   PainSc: Asleep                 Shelton SilvasKevin D Wallace Cogliano

## 2018-03-23 NOTE — Transfer of Care (Signed)
Immediate Anesthesia Transfer of Care Note  Patient: Erin Gonzales  Procedure(s) Performed: MAMMARY REDUCTION  (BREAST) (Bilateral Breast)  Patient Location: PACU  Anesthesia Type:General  Level of Consciousness: awake, alert  and oriented  Airway & Oxygen Therapy: Patient Spontanous Breathing and Patient connected to face mask oxygen  Post-op Assessment: Report given to RN and Post -op Vital signs reviewed and stable  Post vital signs: Reviewed and stable  Last Vitals:  Vitals Value Taken Time  BP 107/72 03/23/2018 11:43 AM  Temp    Pulse 106 03/23/2018 11:44 AM  Resp 14 03/23/2018 11:44 AM  SpO2 100 % 03/23/2018 11:44 AM  Vitals shown include unvalidated device data.  Last Pain:  Vitals:   03/23/18 0636  TempSrc: Oral         Complications: No apparent anesthesia complications

## 2018-03-23 NOTE — Discharge Instructions (Signed)
Post Anesthesia Home Care Instructions  Activity: Get plenty of rest for the remainder of the day. A responsible individual must stay with you for 24 hours following the procedure.  For the next 24 hours, DO NOT: -Drive a car -Advertising copywriter -Drink alcoholic beverages -Take any medication unless instructed by your physician -Make any legal decisions or sign important papers.  Meals: Start with liquid foods such as gelatin or soup. Progress to regular foods as tolerated. Avoid greasy, spicy, heavy foods. If nausea and/or vomiting occur, drink only clear liquids until the nausea and/or vomiting subsides. Call your physician if vomiting continues.  Special Instructions/Symptoms: Your throat may feel dry or sore from the anesthesia or the breathing tube placed in your throat during surgery. If this causes discomfort, gargle with warm salt water. The discomfort should disappear within 24 hours.  If you had a scopolamine patch placed behind your ear for the management of post- operative nausea and/or vomiting:  1. The medication in the patch is effective for 72 hours, after which it should be removed.  Wrap patch in a tissue and discard in the trash. Wash hands thoroughly with soap and water. 2. You may remove the patch earlier than 72 hours if you experience unpleasant side effects which may include dry mouth, dizziness or visual disturbances. 3. Avoid touching the patch. Wash your hands with soap and water after contact with the patch.       JP Drain [employer] this sheet to all of your post-operative appointments while you have your drains.  Please measure your drains by CC's or ML's.  Make sure you drain and measure your JP Drains 2 or 3 times per day.  At the end of each day, add up totals for the left side and add up totals for the right side.    ( 9 am )     ( 3 pm )        ( 9 pm )                Date L  R  L  R  L  R  Total L/R                                                                                                                                                                                 Information for Discharge Teaching: EXPAREL (bupivacaine liposome injectable suspension)   Your surgeon gave you EXPAREL(bupivacaine) in your surgical incision to help control your pain after surgery.   EXPAREL is a local anesthetic that provides pain relief by numbing the tissue around the surgical site.  EXPAREL is designed to release pain medication over  time and can control pain for up to 72 hours.  Depending on how you respond to EXPAREL, you may require less pain medication during your recovery.  Possible side effects:  Temporary loss of sensation or ability to move in the area where bupivacaine was injected.  Nausea, vomiting, constipation  Rarely, numbness and tingling in your mouth or lips, lightheadedness, or anxiety may occur.  Call your doctor right away if you think you may be experiencing any of these sensations, or if you have other questions regarding possible side effects.  Follow all other discharge instructions given to you by your surgeon or nurse. Eat a healthy diet and drink plenty of water or other fluids.  If you return to the hospital for any reason within 96 hours following the administration of EXPAREL, please inform your health care providers.  About my Jackson-Pratt Bulb Drain  What is a Jackson-Pratt bulb? A Jackson-Pratt is a soft, round device used to collect drainage. It is connected to a long, thin drainage catheter, which is held in place by one or two small stiches near your surgical incision site. When the bulb is squeezed, it forms a vacuum, forcing the drainage to empty into the bulb.  Emptying the Jackson-Pratt bulb- To empty the bulb: 1. Release the plug on the top of the bulb. 2. Pour the bulb's contents into a measuring container which your nurse will provide. 3. Record the time emptied  and amount of drainage. Empty the drain(s) as often as your     doctor or nurse recommends.  Date                  Time                    Amount (Drain 1)                 Amount (Drain 2)  _____________________________________________________________________  _____________________________________________________________________  _____________________________________________________________________  _____________________________________________________________________  _____________________________________________________________________  _____________________________________________________________________  _____________________________________________________________________  _____________________________________________________________________  Squeezing the Jackson-Pratt Bulb- To squeeze the bulb: 1. Make sure the plug at the top of the bulb is open. 2. Squeeze the bulb tightly in your fist. You will hear air squeezing from the bulb. 3. Replace the plug while the bulb is squeezed. 4. Use a safety pin to attach the bulb to your clothing. This will keep the catheter from     pulling at the bulb insertion site.  When to call your doctor- Call your doctor if:  Drain site becomes red, swollen or hot.  You have a fever greater than 101 degrees F.  There is oozing at the drain site.  Drain falls out (apply a guaze bandage over the drain hole and secure it with tape).  Drainage increases daily not related to activity patterns. (You will usually have more drainage when you are active than when you are resting.)  Drainage has a bad odor.

## 2018-03-23 NOTE — Op Note (Signed)
Operative Note   DATE OF OPERATION: 6.11.19  LOCATION: [facility]-outpatient  SURGICAL DIVISION: Plastic Surgery  PREOPERATIVE DIAGNOSES:  1. Macromastia 2. Chronic neck and back pain  POSTOPERATIVE DIAGNOSES:  same  PROCEDURE:  Bilateral breast reduction  SURGEON: Erin Gonzales  ASSISTANT: none  ANESTHESIA:  General.   EBL: 50 ml  COMPLICATIONS: None immediate.   INDICATIONS FOR PROCEDURE:  The patient, Erin Gonzales, is a 32 y.o. female born on 11/02/1985, is here for bilateral breast reduction in setting of macromastia, chronic neck and back pain that has failed conservative measures. She has a history prior left breast benign lumpectomy with intermittent wound over left NAC.   FINDINGS: Left areolar scar without drainage or open areas at time of surgery; scar excised and chronic granulation tissue noted within dermis beneath scar. Right reduction 1100 g Left 870 g  DESCRIPTION OF PROCEDURE:  The patient was marked standing in the preoperative area to mark sternal notch, chest midline, anterior axillary lines, inframammary folds. The location of new nipple areolar complex was marked at level of on inframammary fold on anterior surface breast by palpation. This was marked symmetric over bilateral breasts. With aid of Wise pattern marker, location of new nipple areolar complex and vertical limbs (8cm) were marked. The patient was taken to the operating room. SCDs were placed and IV antibiotics were given. The patient's operative site was prepped and draped in a sterile fashion. A time out was performed and all information was confirmed to be correct.   Over left breast, superomedial pedicle marked and nipple areolar complex marked with45 mm diameter marker. Pedicle deepithlialized and developedto chest wall. Breast tissue resected over lower pole. Medial and lateral flaps developed.Additional superior pole and lateral chest wall tissue excised.Breast tailor  tacked closed.  I then directed attention to right breast where superomedial pedicle designed. The pedicle was deepithelialized and developed toward chest wall until tension free closure obtained. Lower pole, lateral chest wall, and superior pole breast tissuee excised. Breast tailor tacked closed, and patient brought to upright sitting position and assessed for symmetry. Patent returned to supine position and breast cavities irrigated and hemostasis obtained. Exparel infiltrated throughout each breast. 15 Fr JP placed in each breast and secured with 2-0 nylon. Closure completed with 3-0 vicryl to approximate dermis along inframammary fold and vertical limb. NAC inset with 4-0 vicryl in dermis. Skin closure completed with 4-0 monocryl subcuticular throughout.Tissue adhesive applied. Dry dressing and breast binder applied.  The patient was allowed to wake from anesthesia, extubated and taken to the recovery room in satisfactory condition.   SPECIMENS: right and left breast reduction.  DRAINS: 15 Fr JP in right and left breast  Erin FellowsBrinda Daron Breeding, [facility] Plastic & Reconstructive Surgery [phone], pin [phone]

## 2018-03-23 NOTE — Anesthesia Procedure Notes (Signed)
Procedure Name: Intubation Date/Time: 03/23/2018 7:36 AM Performed by: Willa Frater, CRNA Pre-anesthesia Checklist: Patient identified, Emergency Drugs available, Suction available and Patient being monitored Patient Re-evaluated:Patient Re-evaluated prior to induction Oxygen Delivery Method: Circle system utilized Preoxygenation: Pre-oxygenation with 100% oxygen Induction Type: IV induction Ventilation: Mask ventilation without difficulty Laryngoscope Size: Mac and 3 Grade View: Grade I Tube type: Oral Number of attempts: 1 Airway Equipment and Method: Stylet and Oral airway Placement Confirmation: ETT inserted through vocal cords under direct vision,  positive ETCO2 and breath sounds checked- equal and bilateral Secured at: 22 cm Tube secured with: Tape Dental Injury: Teeth and Oropharynx as per pre-operative assessment

## 2018-03-23 NOTE — Interval H&P Note (Signed)
History and Physical Interval Note:  03/23/2018 6:38 AM  Erin SicklesSacha Gonzales  has presented today for surgery, with the diagnosis of macromastia, chronic neck and back pain  The various methods of treatment have been discussed with the patient and family. After consideration of risks, benefits and other options for treatment, the patient has consented to  Procedure(s): MAMMARY REDUCTION  (BREAST) (Bilateral) as a surgical intervention .  The patient's history has been reviewed, patient examined, no change in status, stable for surgery.  I have reviewed the patient's chart and labs.  Questions were answered to the patient's satisfaction.     Harvey Lingo

## 2018-03-24 ENCOUNTER — Encounter (HOSPITAL_BASED_OUTPATIENT_CLINIC_OR_DEPARTMENT_OTHER): Payer: Self-pay | Admitting: Plastic Surgery

## 2018-03-24 NOTE — Addendum Note (Signed)
Addendum  created 03/24/18 0750 by Ronnette HilaPayne, Sylvain Hasten D, CRNA   Charge Capture section accepted

## 2019-11-29 ENCOUNTER — Encounter (HOSPITAL_COMMUNITY): Payer: Self-pay

## 2019-11-29 ENCOUNTER — Other Ambulatory Visit: Payer: Self-pay

## 2019-11-29 ENCOUNTER — Emergency Department (HOSPITAL_COMMUNITY)
Admission: EM | Admit: 2019-11-29 | Discharge: 2019-11-29 | Disposition: A | Discharge status: Home / Self Care | Payer: 59 | Attending: Emergency Medicine | Admitting: Emergency Medicine

## 2019-11-29 ENCOUNTER — Telehealth: Payer: Self-pay

## 2019-11-29 DIAGNOSIS — Z79899 Other long term (current) drug therapy: Secondary | ICD-10-CM | POA: Diagnosis not present

## 2019-11-29 DIAGNOSIS — Z7982 Long term (current) use of aspirin: Secondary | ICD-10-CM | POA: Diagnosis not present

## 2019-11-29 DIAGNOSIS — N611 Abscess of the breast and nipple: Secondary | ICD-10-CM

## 2019-11-29 NOTE — Progress Notes (Signed)
CSW spoke with [place], PA regarding the patient's need for a referral to the Breast and Cervical Cancer Control Program through [facility].   CSW attempted to reach staff at [facility] at [phone], a voicemail was left requesting a return call.  Edwin Dada, MSW, LCSW-A Transitions of Care  Clinical Social Worker  [facility] Emergency Departments  Medical ICU [phone]

## 2019-11-29 NOTE — Discharge Instructions (Addendum)
Please continue the antibiotics that you have been given by your PCP.  You should use warm compresses at home to help encourage drainage from the wound.  The breast and cervical cancer control program should be reaching out to you hopefully by the end of the day to schedule an appointment for follow-up.  If you do not hear from them today, please call the office tomorrow morning.  The number for the scheduler, Gunnar Fusi, is [phone].  You were also given information for the main office and address of the office.  You can also walk into the office and start the application process.  If you ultimately do not qualify for the BCCCP program, please contact your PCP about placing the orders for your ultrasounds. I have made her aware that you may need to reach out to there about this.   Please return to the emergency room immediately if you experience any new or worsening symptoms or any symptoms that indicate worsening infection such as fevers, increased redness/swelling/pain, warmth, or drainage from the affected area.

## 2019-11-29 NOTE — ED Notes (Signed)
Patient verbalizes understanding of discharge instructions. Opportunity for questioning and answers were provided. Pt discharged from ED. 

## 2019-11-29 NOTE — ED Provider Notes (Signed)
[facility] EMERGENCY DEPARTMENT Provider Note   CSN: [id number] Arrival date & time: 11/29/19  1007     History Chief Complaint  Patient presents with  . Abscess    Erin Gonzales is a 34 y.o. female.  HPI   34 year old female with a history of anxiety, depression, breast cyst, who presents the emergency department today complaining of the left breast abscess.  States that she noticed last month she had some hardened tissue just superior to the left nipple.  She noticed some increased swelling, pain and fluctuance to the left breast that she felt was consistent with an abscess.  She has had a little bit of drainage from the wound.  She has had no fevers or other systemic symptoms.  She saw her PCP on 11/25/2019 and was started on Keflex which she states she has been compliant with.  She states that the swelling and redness has improved somewhat since then but her PCP recommended she come to the ED as she may require incision and drainage.  Past Medical History:  Diagnosis Date  . Anxiety   . Breast cyst   . Depression   . Pneumonia    02/2016     Patient Active Problem List   Diagnosis Date Noted  . CONSTIPATION 05/11/2009    Past Surgical History:  Procedure Laterality Date  . BREAST LUMPECTOMY Left 06/18/2016   Procedure: LEFT BREAST LUMPECTOMY;  Surgeon: Abigail Miyamoto, MD;  Location: [facility];  Service: General;  Laterality: Left;  . BREAST LUMPECTOMY     Left  . BREAST REDUCTION SURGERY Bilateral 03/23/2018   Procedure: MAMMARY REDUCTION  (BREAST);  Surgeon: Glenna Fellows, MD;  Location: [facility];  Service: Plastics;  Laterality: Bilateral;  . HERNIA REPAIR     2 months old   . MASTECTOMY    . MASTECTOMY       OB History    Gravida  1   Para      Term      Preterm      AB  1   Living        SAB      TAB      Ectopic      Multiple      Live Births              Family History  Problem Relation Age of  Onset  . Hyperlipidemia Mother   . Diabetes Father   . Cancer Maternal Grandmother   . Cancer Paternal Grandmother   . Cancer Paternal Grandfather     Social History   Tobacco Use  . Smoking status: Never Smoker  . Smokeless tobacco: Never Used  Substance Use Topics  . Alcohol use: No    Comment: last smoked 03/20/2018  . Drug use: Yes    Types: Marijuana    Comment:  last smoked marijuana last week    Home Medications Prior to Admission medications   Medication Sig Start Date End Date Taking? Authorizing Provider  aspirin 81 MG chewable tablet Chew by mouth daily.    [provider]  escitalopram (LEXAPRO) 5 MG tablet Take 5 mg by mouth daily.    [provider]  levocetirizine (XYZAL) 5 MG tablet Take 5 mg by mouth daily as needed for allergies.     [provider]    Allergies    Septra [sulfamethoxazole-trimethoprim], Apple, and Shellfish allergy  Review of Systems   Review of Systems  Constitutional: Negative for chills and fever.  Cardiovascular:       Left breast pain  Skin: Positive for color change and wound.      Physical Exam Updated Vital Signs BP 103/77   Pulse 85   Temp 98.4 F (36.9 C) (Oral)   Resp 16   Ht 4\' 11"  (1.499 m)   Wt 81.6 kg   SpO2 100%   BMI 36.36 kg/m   Physical Exam Vitals and nursing note reviewed.  Constitutional:      General: She is not in acute distress.    Appearance: She is well-developed.  HENT:     Head: Normocephalic and atraumatic.  Eyes:     Conjunctiva/sclera: Conjunctivae normal.  Cardiovascular:     Rate and Rhythm: Normal rate.  Pulmonary:     Effort: Pulmonary effort is normal.  Chest:     Breasts:        Right: No nipple discharge.        Left: Skin change and tenderness present. No nipple discharge.       Comments: 3x2 cm area of fluctuance to the left breast as indicated above. There is erythema and TTP in this area as well.  Musculoskeletal:        General: Normal  range of motion.     Cervical back: Neck supple.  Skin:    General: Skin is warm and dry.  Neurological:     Mental Status: She is alert.     ED Results / Procedures / Treatments   Labs (all labs ordered are listed, but only abnormal results are displayed) Labs Reviewed - No data to display  EKG None  Radiology No results found.  Procedures Procedures (including critical care time)  Medications Ordered in ED Medications - No data to display  ED Course  I have reviewed the triage vital signs and the nursing notes.  Pertinent labs & imaging results that were available during my care of the patient were reviewed by me and considered in my medical decision making (see chart for details).    MDM Rules/Calculators/A&P                       34 year old female presenting with left breast abscess.  Has had minimal drainage.  No systemic symptoms.  Vital signs today are normal.  She is nontoxic, nonseptic appearing.  She has been on outpatient antibiotics which she states has been improving her symptoms however her symptoms have not completely resolved and her PCP sent her here due to concerns that the abscess needs to be drained.  Pt will need referral to the breast center but she is concerned about cost to due her insurance.  12:00 PM Discussed case with nursing staff at pt's PCP office. She states pts pcp is not at this particular clinic today. Will send epic msg to pts pcp.   12:40 Contacted Christine Branock from [facility]. I have sent a referral through Epic and she will forward this to the appropriate contact at [facility] who will reach out to the patient and likely get her scheduled to be seen within the next 2 days.   Discussed plan for follow-up with the patient.  She was also given telephone number and address information to follow-up with [facility].  Have advised her to continue Keflex as she states she has had some improvement in her symptoms since starting this.  She does not  appear to be systemically ill at this time  and I feel that she is appropriate for outpatient follow-up.  Advised on specific return precautions.  The patient voices understanding of the plan and reasons to return.  All questions answered.  Patient stable for discharge.   Final Clinical Impression(s) / ED Diagnoses Final diagnoses:  Breast abscess    Rx / DC Orders ED Discharge Orders    None       Karrie Meres, PA-C 11/29/19 1320    Wayne, DO 11/29/19 1634

## 2019-11-29 NOTE — Telephone Encounter (Signed)
Telephoned patient at home number. Left a voice message to call and schedule an appointment with [facility].

## 2019-11-29 NOTE — ED Triage Notes (Signed)
Pt has abscess to left breast that is draining, pt saw PCP and was given antibiotic but sent here because PCP states it needs to be drained.

## 2019-12-01 ENCOUNTER — Other Ambulatory Visit: Payer: 59

## 2019-12-01 ENCOUNTER — Ambulatory Visit: Payer: 59

## 2019-12-06 ENCOUNTER — Ambulatory Visit: Payer: Self-pay

## 2019-12-06 ENCOUNTER — Other Ambulatory Visit: Payer: Self-pay

## 2019-12-06 ENCOUNTER — Other Ambulatory Visit: Payer: 59

## 2020-01-17 ENCOUNTER — Ambulatory Visit: Payer: Self-pay | Admitting: Student

## 2020-01-17 ENCOUNTER — Other Ambulatory Visit: Payer: Self-pay | Admitting: Obstetrics and Gynecology

## 2020-01-17 ENCOUNTER — Other Ambulatory Visit: Payer: Self-pay

## 2020-01-17 ENCOUNTER — Ambulatory Visit
Admission: RE | Admit: 2020-01-17 | Discharge: 2020-01-17 | Disposition: A | Discharge status: Home / Self Care | Payer: Medicaid Other | Source: Ambulatory Visit | Attending: Obstetrics and Gynecology | Admitting: Obstetrics and Gynecology

## 2020-01-17 ENCOUNTER — Ambulatory Visit
Admission: RE | Admit: 2020-01-17 | Discharge: 2020-01-17 | Disposition: A | Discharge status: Home / Self Care | Payer: No Typology Code available for payment source | Source: Ambulatory Visit | Attending: Obstetrics and Gynecology | Admitting: Obstetrics and Gynecology

## 2020-01-17 VITALS — BP 108/74 | Temp 98.2°F | Wt 177.0 lb

## 2020-01-17 DIAGNOSIS — N611 Abscess of the breast and nipple: Secondary | ICD-10-CM

## 2020-01-17 DIAGNOSIS — Z1239 Encounter for other screening for malignant neoplasm of breast: Secondary | ICD-10-CM

## 2020-01-17 NOTE — Progress Notes (Signed)
Ms. Erin Gonzales is a 34 y.o. female who presents to [facility] clinic today with complaint of unhealed abscess in left breast. She was diagnosed with abscess in February and placed on Keflex by PCP, reports that the swelling has gone down but it still comes and goes.    Pap Smear: Pap not smear completed today. Last Pap smear was Erin Eaves NP in 2019 and it was negative. and was normal. Per patient has no history of an abnormal Pap smear. Last Pap smear result is not available in Epic.   Physical exam: Breasts Breasts symmetrical. No skin abnormalities bilateral breasts. No nipple retraction bilateral breasts. No nipple discharge bilateral breasts. No lymphadenopathy. No lumps palpated bilateral breasts.       Pelvic/Bimanual Pap is not indicated today    Smoking History: Patient has never smoked.    Patient Navigation: Patient education provided. Access to services provided for patient through [facility] program.  Colorectal Cancer Screening: Per patient has never had colonoscopy completed No complaints today.    Breast and Cervical Cancer Risk Assessment: Patient does not have family history of breast cancer, known genetic mutations, or radiation treatment to the chest before age 73. Patient does not have history of cervical dysplasia, immunocompromised, or DES exposure in-utero.  Risk Assessment    Risk Scores      01/17/2020   Last edited by: Erin Rutherford, LPN   5-year risk:    Lifetime risk:           A: [facility] exam without pap smear Complaint of pain in her left breast; recommended that she contact Erin Gonzales to be seen again.   P: Referred patient to the [facility] for a diagnostic mammogram. Appointment scheduled today  Erin Gonzales, CNM 01/17/2020 10:51 AM

## 2020-01-31 ENCOUNTER — Other Ambulatory Visit: Payer: Self-pay

## 2020-01-31 ENCOUNTER — Ambulatory Visit
Admission: RE | Admit: 2020-01-31 | Discharge: 2020-01-31 | Disposition: A | Discharge status: Home / Self Care | Payer: No Typology Code available for payment source | Source: Ambulatory Visit | Attending: Obstetrics and Gynecology | Admitting: Obstetrics and Gynecology

## 2020-01-31 ENCOUNTER — Other Ambulatory Visit: Payer: Self-pay | Admitting: Obstetrics and Gynecology

## 2020-01-31 DIAGNOSIS — N611 Abscess of the breast and nipple: Secondary | ICD-10-CM

## 2020-02-14 ENCOUNTER — Other Ambulatory Visit: Payer: Self-pay

## 2020-02-14 ENCOUNTER — Ambulatory Visit
Admission: RE | Admit: 2020-02-14 | Discharge: 2020-02-14 | Disposition: A | Discharge status: Home / Self Care | Payer: No Typology Code available for payment source | Source: Ambulatory Visit | Attending: Obstetrics and Gynecology | Admitting: Obstetrics and Gynecology

## 2020-02-14 ENCOUNTER — Other Ambulatory Visit: Payer: Self-pay | Admitting: Obstetrics and Gynecology

## 2020-02-14 DIAGNOSIS — N611 Abscess of the breast and nipple: Secondary | ICD-10-CM

## 2020-03-07 ENCOUNTER — Inpatient Hospital Stay: Admission: RE | Admit: 2020-03-07 | Payer: No Typology Code available for payment source | Source: Ambulatory Visit

## 2020-06-01 ENCOUNTER — Other Ambulatory Visit: Payer: Self-pay | Admitting: Obstetrics and Gynecology

## 2020-06-01 ENCOUNTER — Telehealth: Payer: Self-pay

## 2020-06-01 DIAGNOSIS — N611 Abscess of the breast and nipple: Secondary | ICD-10-CM

## 2020-06-01 NOTE — Telephone Encounter (Signed)
Patient calls states she needs to do a follow-up ultrasound for left breast abscess. Both parents became ill, one parent passed away, patient was not able to do follow-up, and is now ready to follow-up, states the abscess is not healed. Patient was told to call [facility]. Per Fonnie Mu, RN, she does not need to follow-up with [facility], not a new problem, Pink card still in date. Per Cherish [facility], will contact patient with appointment. Left detailed message on patient's voicemail informing her that the [facility] will be calling her.

## 2020-06-04 ENCOUNTER — Telehealth: Payer: Self-pay

## 2020-06-04 NOTE — Telephone Encounter (Signed)
error 

## 2020-06-04 NOTE — Telephone Encounter (Signed)
Returned patient's phone call about follow-up appointment with the breast center. Left name and number for patient to call back.

## 2020-06-05 ENCOUNTER — Other Ambulatory Visit: Payer: Self-pay | Admitting: Obstetrics and Gynecology

## 2020-06-05 ENCOUNTER — Other Ambulatory Visit: Payer: Self-pay

## 2020-06-05 ENCOUNTER — Ambulatory Visit
Admission: RE | Admit: 2020-06-05 | Discharge: 2020-06-05 | Disposition: A | Discharge status: Home / Self Care | Payer: No Typology Code available for payment source | Source: Ambulatory Visit | Attending: Obstetrics and Gynecology | Admitting: Obstetrics and Gynecology

## 2020-06-05 DIAGNOSIS — N611 Abscess of the breast and nipple: Secondary | ICD-10-CM

## 2020-06-08 ENCOUNTER — Other Ambulatory Visit: Payer: Self-pay | Admitting: Surgery

## 2020-08-07 ENCOUNTER — Encounter (HOSPITAL_BASED_OUTPATIENT_CLINIC_OR_DEPARTMENT_OTHER): Payer: Self-pay | Admitting: Surgery

## 2020-08-07 ENCOUNTER — Other Ambulatory Visit: Payer: Self-pay

## 2020-08-11 ENCOUNTER — Inpatient Hospital Stay (HOSPITAL_COMMUNITY): Admission: RE | Admit: 2020-08-11 | Payer: No Typology Code available for payment source | Source: Ambulatory Visit

## 2020-08-13 ENCOUNTER — Other Ambulatory Visit (HOSPITAL_COMMUNITY)
Admission: RE | Admit: 2020-08-13 | Discharge: 2020-08-13 | Disposition: A | Discharge status: Home / Self Care | Payer: HRSA Program | Source: Ambulatory Visit | Attending: Surgery | Admitting: Surgery

## 2020-08-13 DIAGNOSIS — Z20822 Contact with and (suspected) exposure to covid-19: Secondary | ICD-10-CM | POA: Insufficient documentation

## 2020-08-13 DIAGNOSIS — Z01812 Encounter for preprocedural laboratory examination: Secondary | ICD-10-CM | POA: Diagnosis present

## 2020-08-13 LAB — SARS CORONAVIRUS 2 (TAT 6-24 HRS): SARS Coronavirus 2: NEGATIVE

## 2020-08-14 NOTE — H&P (Signed)
   Erin Gonzales  Location: [facility] Patient #: [id number] DOB: 07/17/1986 Single / Language: [place] / Race: Black or African American Female   History of Present Illness  The patient is a 34 year old female who presents for wound check.  Chief complaint: Nonhealing wound of left breast  This is a 34 year old female who has had previous infections or left breast. She has since had a breast reduction in 2019. Since that time, she developed a recurrent wound at the 4 o'clock position of the areola along her old scar which is intermittently drained. She has had several courses of antibiotics and ultrasounds at the breast center. Most recent ultrasound still shows a fluid/phlegmonous component measuring 1 cm in size. She reports that she still has intermittent drainage but is no longer antibiotics and has no pain. There is no nipple discharge. She has otherwise been doing well and healthy. She tolerated her reduction well   Allergies  Peanut (Diagnostic)  Sulfacetamide *CHEMICALS*  rash Sulfa Antibiotics  Allergies Reconciled   Medication History Ventolin HFA (108 (90 Base)MCG/ACT Aerosol Soln, Inhalation) Active. Vitamin D (Ergocalciferol) (50000UNIT Capsule, Oral) Active. Medications Reconciled  Vitals (Chemira Jones CMA;  Weight: 175 lb Height: 59in Body Surface Area: 1.74 m Body Mass Index: 35.35 kg/m  Temp.: 97.30F  Pulse: 93 (Regular)  BP: 126/82(Sitting, Left Arm, Standard)     Physical Exam  The physical exam findings are as follows: Note: On exam, she appears well  There is a small wound on the left breast along the scar at approximately the 4 o'clock position of the areola. It is currently closed over and there is no erythema or tenderness    Assessment & Plan  NON-HEALING SURGICAL WOUND LEFT BREAST (T81.89XA)  Impression: I reviewed the patient's notes in the electronic medical records. I reviewed her ultrasound as  well. As this wound has now persisted for over 2 years intermittently, surgical excision of this area is recommended to prevent ongoing infections as well as to rule out malignancy. I discussed the risk which includes but is not limited to bleeding, ongoing infection, having another chronic open wound, the need for further procedures, etc. She understands and wishes to proceed with surgery which will be scheduled.

## 2020-08-15 ENCOUNTER — Other Ambulatory Visit: Payer: Self-pay

## 2020-08-15 ENCOUNTER — Ambulatory Visit (HOSPITAL_BASED_OUTPATIENT_CLINIC_OR_DEPARTMENT_OTHER): Payer: No Typology Code available for payment source | Admitting: Anesthesiology

## 2020-08-15 ENCOUNTER — Encounter (HOSPITAL_BASED_OUTPATIENT_CLINIC_OR_DEPARTMENT_OTHER): Payer: Self-pay | Admitting: Surgery

## 2020-08-15 ENCOUNTER — Encounter (HOSPITAL_BASED_OUTPATIENT_CLINIC_OR_DEPARTMENT_OTHER)
Admission: RE | Disposition: A | Discharge status: Home / Self Care | Payer: Self-pay | Source: Home / Self Care | Attending: Surgery

## 2020-08-15 ENCOUNTER — Ambulatory Visit (HOSPITAL_BASED_OUTPATIENT_CLINIC_OR_DEPARTMENT_OTHER)
Admission: RE | Admit: 2020-08-15 | Discharge: 2020-08-15 | Disposition: A | Discharge status: Home / Self Care | Payer: No Typology Code available for payment source | Attending: Surgery | Admitting: Surgery

## 2020-08-15 DIAGNOSIS — Z882 Allergy status to sulfonamides status: Secondary | ICD-10-CM | POA: Insufficient documentation

## 2020-08-15 DIAGNOSIS — X58XXXA Exposure to other specified factors, initial encounter: Secondary | ICD-10-CM | POA: Insufficient documentation

## 2020-08-15 DIAGNOSIS — N61 Mastitis without abscess: Secondary | ICD-10-CM | POA: Insufficient documentation

## 2020-08-15 DIAGNOSIS — T8189XA Other complications of procedures, not elsewhere classified, initial encounter: Secondary | ICD-10-CM | POA: Insufficient documentation

## 2020-08-15 HISTORY — PX: BREAST CYST EXCISION: SHX579

## 2020-08-15 LAB — POCT PREGNANCY, URINE: Preg Test, Ur: NEGATIVE

## 2020-08-15 SURGERY — EXCISION, CYST, BREAST
Anesthesia: General | Site: Breast | Laterality: Left

## 2020-08-15 MED ORDER — DOXYCYCLINE HYCLATE 100 MG PO TABS: 100.0000 mg | ORAL_TABLET | Freq: Two times a day (BID) | ORAL | 1 refills | Status: DC

## 2020-08-15 MED ORDER — PROPOFOL 10 MG/ML IV BOLUS
INTRAVENOUS | Status: AC
  Filled 2020-08-15: qty 20

## 2020-08-15 MED ORDER — LIDOCAINE HCL (CARDIAC) PF 100 MG/5ML IV SOSY
PREFILLED_SYRINGE | INTRAVENOUS | Status: DC | PRN
  Administered 2020-08-15: 50 mg via INTRAVENOUS

## 2020-08-15 MED ORDER — LACTATED RINGERS IV SOLN: INTRAVENOUS | Status: DC

## 2020-08-15 MED ORDER — PROPOFOL 10 MG/ML IV BOLUS
INTRAVENOUS | Status: DC | PRN
  Administered 2020-08-15: 180 mg via INTRAVENOUS

## 2020-08-15 MED ORDER — MIDAZOLAM HCL 2 MG/2ML IJ SOLN
INTRAMUSCULAR | Status: AC
  Filled 2020-08-15: qty 2

## 2020-08-15 MED ORDER — CHLORHEXIDINE GLUCONATE CLOTH 2 % EX PADS: 6.0000 | MEDICATED_PAD | Freq: Once | CUTANEOUS | Status: DC

## 2020-08-15 MED ORDER — ACETAMINOPHEN 500 MG PO TABS
1000.0000 mg | ORAL_TABLET | ORAL | Status: AC
  Administered 2020-08-15: 1000 mg via ORAL

## 2020-08-15 MED ORDER — KETOROLAC TROMETHAMINE 15 MG/ML IJ SOLN
15.0000 mg | INTRAMUSCULAR | Status: AC
  Administered 2020-08-15: 15 mg via INTRAVENOUS

## 2020-08-15 MED ORDER — CEFAZOLIN SODIUM-DEXTROSE 2-4 GM/100ML-% IV SOLN
2.0000 g | INTRAVENOUS | Status: AC
  Administered 2020-08-15: 2 g via INTRAVENOUS

## 2020-08-15 MED ORDER — ACETAMINOPHEN 500 MG PO TABS
ORAL_TABLET | ORAL | Status: AC
  Filled 2020-08-15: qty 2

## 2020-08-15 MED ORDER — OXYCODONE HCL 5 MG PO TABS
ORAL_TABLET | ORAL | Status: AC
  Filled 2020-08-15: qty 1

## 2020-08-15 MED ORDER — ONDANSETRON HCL 4 MG/2ML IJ SOLN: 4.0000 mg | Freq: Once | INTRAMUSCULAR | Status: DC | PRN

## 2020-08-15 MED ORDER — LIDOCAINE 2% (20 MG/ML) 5 ML SYRINGE
INTRAMUSCULAR | Status: AC
  Filled 2020-08-15: qty 5

## 2020-08-15 MED ORDER — ONDANSETRON HCL 4 MG/2ML IJ SOLN
INTRAMUSCULAR | Status: DC | PRN
  Administered 2020-08-15: 4 mg via INTRAVENOUS

## 2020-08-15 MED ORDER — GABAPENTIN 300 MG PO CAPS
ORAL_CAPSULE | ORAL | Status: AC
  Filled 2020-08-15: qty 1

## 2020-08-15 MED ORDER — MIDAZOLAM HCL 5 MG/5ML IJ SOLN
INTRAMUSCULAR | Status: DC | PRN
  Administered 2020-08-15: 2 mg via INTRAVENOUS

## 2020-08-15 MED ORDER — KETOROLAC TROMETHAMINE 15 MG/ML IJ SOLN
INTRAMUSCULAR | Status: AC
  Filled 2020-08-15: qty 1

## 2020-08-15 MED ORDER — OXYCODONE HCL 5 MG PO TABS
5.0000 mg | ORAL_TABLET | Freq: Once | ORAL | Status: AC
  Administered 2020-08-15: 5 mg via ORAL

## 2020-08-15 MED ORDER — FENTANYL CITRATE (PF) 100 MCG/2ML IJ SOLN
INTRAMUSCULAR | Status: DC | PRN
  Administered 2020-08-15: 50 ug via INTRAVENOUS

## 2020-08-15 MED ORDER — MEPERIDINE HCL 25 MG/ML IJ SOLN: 6.2500 mg | INTRAMUSCULAR | Status: DC | PRN

## 2020-08-15 MED ORDER — ENSURE PRE-SURGERY PO LIQD: 296.0000 mL | Freq: Once | ORAL | Status: DC

## 2020-08-15 MED ORDER — GABAPENTIN 300 MG PO CAPS
300.0000 mg | ORAL_CAPSULE | ORAL | Status: AC
  Administered 2020-08-15: 300 mg via ORAL

## 2020-08-15 MED ORDER — DEXAMETHASONE SODIUM PHOSPHATE 4 MG/ML IJ SOLN
INTRAMUSCULAR | Status: DC | PRN
  Administered 2020-08-15: 5 mg via INTRAVENOUS

## 2020-08-15 MED ORDER — BUPIVACAINE HCL 0.5 % IJ SOLN
INTRAMUSCULAR | Status: DC | PRN
  Administered 2020-08-15: 20 mL

## 2020-08-15 MED ORDER — OXYCODONE HCL 5 MG PO TABS
5.0000 mg | ORAL_TABLET | Freq: Four times a day (QID) | ORAL | 0 refills | Status: DC | PRN
Start: 2020-08-15 — End: 2022-04-08

## 2020-08-15 MED ORDER — CEFAZOLIN SODIUM-DEXTROSE 2-4 GM/100ML-% IV SOLN
INTRAVENOUS | Status: AC
  Filled 2020-08-15: qty 100

## 2020-08-15 MED ORDER — HYDROMORPHONE HCL 1 MG/ML IJ SOLN: 0.2500 mg | INTRAMUSCULAR | Status: DC | PRN

## 2020-08-15 MED ORDER — ONDANSETRON HCL 4 MG/2ML IJ SOLN
INTRAMUSCULAR | Status: AC
  Filled 2020-08-15: qty 2

## 2020-08-15 MED ORDER — FENTANYL CITRATE (PF) 100 MCG/2ML IJ SOLN
INTRAMUSCULAR | Status: AC
  Filled 2020-08-15: qty 2

## 2020-08-15 SURGICAL SUPPLY — 39 items
ADH SKN CLS APL DERMABOND .7 (GAUZE/BANDAGES/DRESSINGS) ×1
APL PRP STRL LF DISP 70% ISPRP (MISCELLANEOUS) ×1
BLADE SURG 15 STRL LF DISP TIS (BLADE) ×1 IMPLANT
BLADE SURG 15 STRL SS (BLADE) ×2
CHLORAPREP W/TINT 26 (MISCELLANEOUS) ×2 IMPLANT
COVER BACK TABLE 60X90IN (DRAPES) ×2 IMPLANT
COVER MAYO STAND STRL (DRAPES) ×2 IMPLANT
DERMABOND ADVANCED (GAUZE/BANDAGES/DRESSINGS) ×1
DERMABOND ADVANCED .7 DNX12 (GAUZE/BANDAGES/DRESSINGS) ×1 IMPLANT
DRAPE LAPAROTOMY 100X72 PEDS (DRAPES) ×2 IMPLANT
DRAPE UTILITY XL STRL (DRAPES) ×2 IMPLANT
ELECT REM PT RETURN 9FT ADLT (ELECTROSURGICAL) ×2
ELECTRODE REM PT RTRN 9FT ADLT (ELECTROSURGICAL) ×1 IMPLANT
GAUZE SPONGE 4X4 12PLY STRL LF (GAUZE/BANDAGES/DRESSINGS) ×3 IMPLANT
GAUZE XEROFORM 1X8 LF (GAUZE/BANDAGES/DRESSINGS) ×1 IMPLANT
GLOVE BIO SURGEON STRL SZ 6.5 (GLOVE) ×1 IMPLANT
GLOVE BIOGEL PI IND STRL 6.5 (GLOVE) IMPLANT
GLOVE BIOGEL PI INDICATOR 6.5 (GLOVE) ×1
GLOVE SURG SIGNA 7.5 PF LTX (GLOVE) ×2 IMPLANT
GOWN STRL REUS W/ TWL LRG LVL3 (GOWN DISPOSABLE) ×1 IMPLANT
GOWN STRL REUS W/ TWL XL LVL3 (GOWN DISPOSABLE) ×1 IMPLANT
GOWN STRL REUS W/TWL LRG LVL3 (GOWN DISPOSABLE) ×2
GOWN STRL REUS W/TWL XL LVL3 (GOWN DISPOSABLE) ×2
NDL HYPO 25X1 1.5 SAFETY (NEEDLE) ×1 IMPLANT
NEEDLE HYPO 25X1 1.5 SAFETY (NEEDLE) ×2 IMPLANT
NS IRRIG 1000ML POUR BTL (IV SOLUTION) ×2 IMPLANT
PACK BASIN DAY SURGERY FS (CUSTOM PROCEDURE TRAY) ×2 IMPLANT
PENCIL SMOKE EVACUATOR (MISCELLANEOUS) ×2 IMPLANT
SLEEVE SCD COMPRESS KNEE MED (MISCELLANEOUS) ×1 IMPLANT
SPONGE LAP 4X18 RFD (DISPOSABLE) ×2 IMPLANT
SUT ETHILON 3 0 PS 1 (SUTURE) ×2 IMPLANT
SUT MNCRL AB 4-0 PS2 18 (SUTURE) ×2 IMPLANT
SUT SILK 2 0 SH (SUTURE) ×2 IMPLANT
SUT VIC AB 3-0 SH 27 (SUTURE) ×2
SUT VIC AB 3-0 SH 27X BRD (SUTURE) ×1 IMPLANT
SYR CONTROL 10ML LL (SYRINGE) ×2 IMPLANT
TOWEL GREEN STERILE FF (TOWEL DISPOSABLE) ×2 IMPLANT
TUBE CONNECTING 20X1/4 (TUBING) ×1 IMPLANT
YANKAUER SUCT BULB TIP NO VENT (SUCTIONS) ×1 IMPLANT

## 2020-08-15 NOTE — Anesthesia Procedure Notes (Signed)
Procedure Name: LMA Insertion Date/Time: 08/15/2020 9:32 AM Performed by: Cleda Clarks, CRNA Pre-anesthesia Checklist: Patient identified, Emergency Drugs available, Suction available and Patient being monitored Patient Re-evaluated:Patient Re-evaluated prior to induction Oxygen Delivery Method: Circle system utilized Preoxygenation: Pre-oxygenation with 100% oxygen Induction Type: IV induction Ventilation: Mask ventilation without difficulty LMA: LMA inserted LMA Size: 3.0 Number of attempts: 1 Airway Equipment and Method: Bite block Placement Confirmation: positive ETCO2 Tube secured with: Tape Dental Injury: Teeth and Oropharynx as per pre-operative assessment

## 2020-08-15 NOTE — Op Note (Signed)
EXCISION OF NON-HEALING LEFT BREAST WOUND  Procedure Note  Erin Gonzales 08/15/2020   Pre-op Diagnosis: NON-HEALING BREAST WOUND LEFT     Post-op Diagnosis: same  Procedure(s): EXCISION OF NON-HEALING LEFT BREAST WOUND  Surgeon(s): Abigail Miyamoto, MD  Anesthesia: General  Staff:  Circulator: Maryan Rued, RN Scrub Person: Barrington Ellison, RN  Estimated Blood Loss: Minimal               Specimens: sent to path  Indications: This is a 34 year old female who previously had bilateral breast reductions in 2019.  She developed a chronic nonhealing wound around the edge of the areola of the left breast.  Because of the failure to heal this wound, the decision was made to proceed with excision of the area.  Procedure: The patient was brought to operating identifies correct patient.  She is placed upon the operating table general anesthesia was induced.  Her left breast was prepped and draped in usual sterile fashion.  There was a small open wound at approximate the 5 o'clock position of the scar at the edge of the areola as well as 1 at the 12 o'clock position.  Anesthetized skin around the scar with Marcaine and then made a circumareolar incision on the lateral edge of the areola with a scalpel.  I excised the overlying skin and scar.  I then took this down to the breast tissue.  There was a lot of chronic granulation tissue at these 2 areas.  There was no purulence.  I excised the chronic relation tissue with cautery.  All the tissue was sent to pathology for evaluation.  I anesthetized the incision further with Marcaine.  I then irrigated the wound with a liter of normal saline.  Hemostasis appeared to be achieved.  I then closed the skin and subcutaneous tissue with interrupted 3-0 nylon sutures.  A piece of Xeroform and gauze were then applied.  The patient tolerated procedure well.  All the counts were correct at the end of the procedure.  The patient was then extubated in the  operating room and taken in stable condition to the recovery room.          Abigail Miyamoto   Date: 08/15/2020  Time: 10:00 AM

## 2020-08-15 NOTE — Transfer of Care (Signed)
Immediate Anesthesia Transfer of Care Note  Patient: Erin Gonzales  Procedure(s) Performed: EXCISION OF NON-HEALING LEFT BREAST WOUND (Left Breast)  Patient Location: PACU  Anesthesia Type:General  Level of Consciousness: awake, alert  and oriented  Airway & Oxygen Therapy: Patient Spontanous Breathing and Patient connected to nasal cannula oxygen  Post-op Assessment: Report given to RN and Post -op Vital signs reviewed and stable  Post vital signs: Reviewed and stable  Last Vitals:  Vitals Value Taken Time  BP 117/75 08/15/20 1006  Temp    Pulse    Resp 16 08/15/20 1010  SpO2    Vitals shown include unvalidated device data.  Last Pain:  Vitals:   08/15/20 0758  TempSrc: Oral  PainSc: 0-No pain      Patients Stated Pain Goal: 8 (08/15/20 0758)  Complications: No complications documented.

## 2020-08-15 NOTE — Discharge Instructions (Signed)
Ok to Human resources officer with a dry gauze daily and as needed  Tylenol and ibuprofen also for pain   No Tylenol before 2:00pm if needed. No ibuprofen before 4:00pm if needed.   Post Anesthesia Home Care Instructions  Activity: Get plenty of rest for the remainder of the day. A responsible individual must stay with you for 24 hours following the procedure.  For the next 24 hours, DO NOT: -Drive a car -Advertising copywriter -Drink alcoholic beverages -Take any medication unless instructed by your physician -Make any legal decisions or sign important papers.  Meals: Start with liquid foods such as gelatin or soup. Progress to regular foods as tolerated. Avoid greasy, spicy, heavy foods. If nausea and/or vomiting occur, drink only clear liquids until the nausea and/or vomiting subsides. Call your physician if vomiting continues.  Special Instructions/Symptoms: Your throat may feel dry or sore from the anesthesia or the breathing tube placed in your throat during surgery. If this causes discomfort, gargle with warm salt water. The discomfort should disappear within 24 hours.  If you had a scopolamine patch placed behind your ear for the management of post- operative nausea and/or vomiting:  1. The medication in the patch is effective for 72 hours, after which it should be removed.  Wrap patch in a tissue and discard in the trash. Wash hands thoroughly with soap and water. 2. You may remove the patch earlier than 72 hours if you experience unpleasant side effects which may include dry mouth, dizziness or visual disturbances. 3. Avoid touching the patch. Wash your hands with soap and water after contact with the patch.

## 2020-08-15 NOTE — Anesthesia Preprocedure Evaluation (Signed)
Anesthesia Evaluation  Patient identified by MRN, date of birth, ID band Patient awake    Reviewed: Allergy & Precautions, NPO status , Patient's Chart, lab work & pertinent test results  Airway Mallampati: I  TM Distance: >3 FB Neck ROM: Full    Dental   Pulmonary    Pulmonary exam normal        Cardiovascular Normal cardiovascular exam     Neuro/Psych Anxiety Depression    GI/Hepatic   Endo/Other    Renal/GU      Musculoskeletal   Abdominal   Peds  Hematology   Anesthesia Other Findings   Reproductive/Obstetrics                             Anesthesia Physical Anesthesia Plan  ASA: II  Anesthesia Plan: General   Post-op Pain Management:    Induction: Intravenous  PONV Risk Score and Plan: 3 and Midazolam, Ondansetron and Treatment may vary due to age or medical condition  Airway Management Planned: LMA  Additional Equipment:   Intra-op Plan:   Post-operative Plan: Extubation in OR  Informed Consent: I have reviewed the patients History and Physical, chart, labs and discussed the procedure including the risks, benefits and alternatives for the proposed anesthesia with the patient or authorized representative who has indicated his/her understanding and acceptance.       Plan Discussed with: CRNA and Surgeon  Anesthesia Plan Comments:         Anesthesia Quick Evaluation

## 2020-08-15 NOTE — Interval H&P Note (Signed)
History and Physical Interval Note:no change in H and P  08/15/2020 8:15 AM  Erin Gonzales  has presented today for surgery, with the diagnosis of NON-HEALING BREAST WOUND LEFT.  The various methods of treatment have been discussed with the patient and family. After consideration of risks, benefits and other options for treatment, the patient has consented to  Procedure(s) with comments: EXCISION OF NON-HEALING LEFT BREAST WOUND (Left) - LMA as a surgical intervention.  The patient's history has been reviewed, patient examined, no change in status, stable for surgery.  I have reviewed the patient's chart and labs.  Questions were answered to the patient's satisfaction.     Erin Gonzales

## 2020-08-15 NOTE — Anesthesia Postprocedure Evaluation (Signed)
Anesthesia Post Note  Patient: Erin Gonzales  Procedure(s) Performed: EXCISION OF NON-HEALING LEFT BREAST WOUND (Left Breast)     Patient location during evaluation: PACU Anesthesia Type: General Level of consciousness: awake and alert Pain management: pain level controlled Vital Signs Assessment: post-procedure vital signs reviewed and stable Respiratory status: spontaneous breathing, nonlabored ventilation, respiratory function stable and patient connected to nasal cannula oxygen Cardiovascular status: blood pressure returned to baseline and stable Postop Assessment: no apparent nausea or vomiting Anesthetic complications: no   No complications documented.  Last Vitals:  Vitals:   08/15/20 1030 08/15/20 1045  BP: (!) 94/57 99/63  Pulse: 69 76  Resp: 18 15  Temp:    SpO2: 100% 100%    Last Pain:  Vitals:   08/15/20 1045  TempSrc:   PainSc: 3                  Zlatan Hornback DAVID

## 2020-08-16 ENCOUNTER — Encounter (HOSPITAL_BASED_OUTPATIENT_CLINIC_OR_DEPARTMENT_OTHER): Payer: Self-pay | Admitting: Surgery

## 2020-08-16 LAB — SURGICAL PATHOLOGY

## 2020-12-24 ENCOUNTER — Telehealth: Payer: Self-pay | Admitting: *Deleted

## 2020-12-24 NOTE — Telephone Encounter (Addendum)
Patient is enrolled in the [facility] program. Patient called and left message about receiving a bill for her breast surgery. Attempted to call patient back. No one answered. Left voicemail for patient to call me back.

## 2021-02-15 IMAGING — US US BREAST*L* LIMITED INC AXILLA
1 series · 7 of 7 positions shown · non-contrast
Comparison: Previous exam(s).

CLINICAL DATA: 34-year-old female presenting with recurrent/chronic
abscess of the right breast for multiple years, with flare in the
recent months. Patient states she underwent course of antibiotics
(keflex) in [REDACTED]. She presents today for 2 week follow-up
following attempted aspiration of a left breast abscess. She states
she has completed 5 days of the recommended 2 week course of
antibiotics.

EXAM:
ULTRASOUND OF THE LEFT BREAST

[Series 1: us breast*left* limited inc axilla · 0.06mm/px · 7 of 7 slices shown]
[im 1/7]
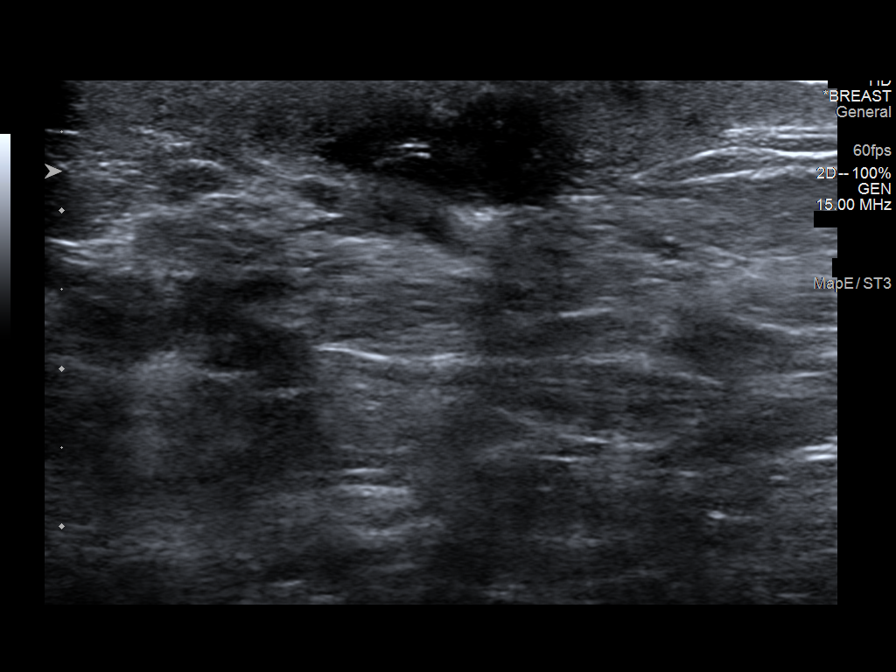
[im 2/7]
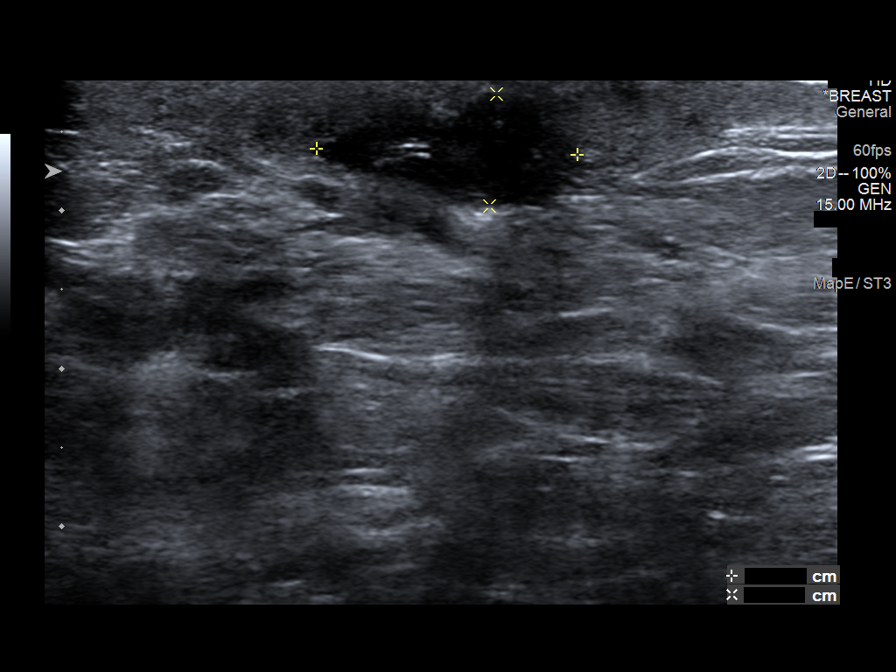
[im 3/7]
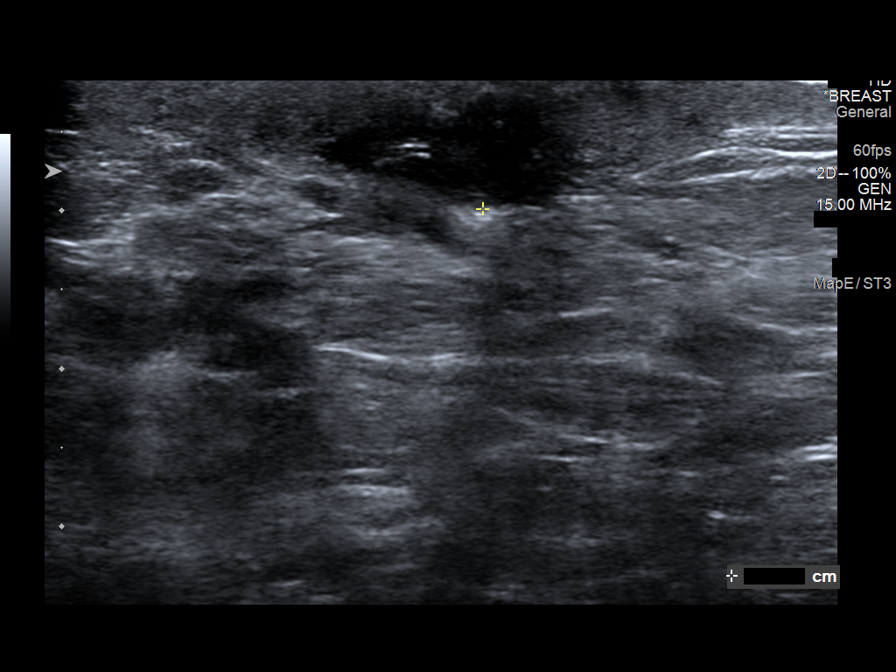
[im 4/7]
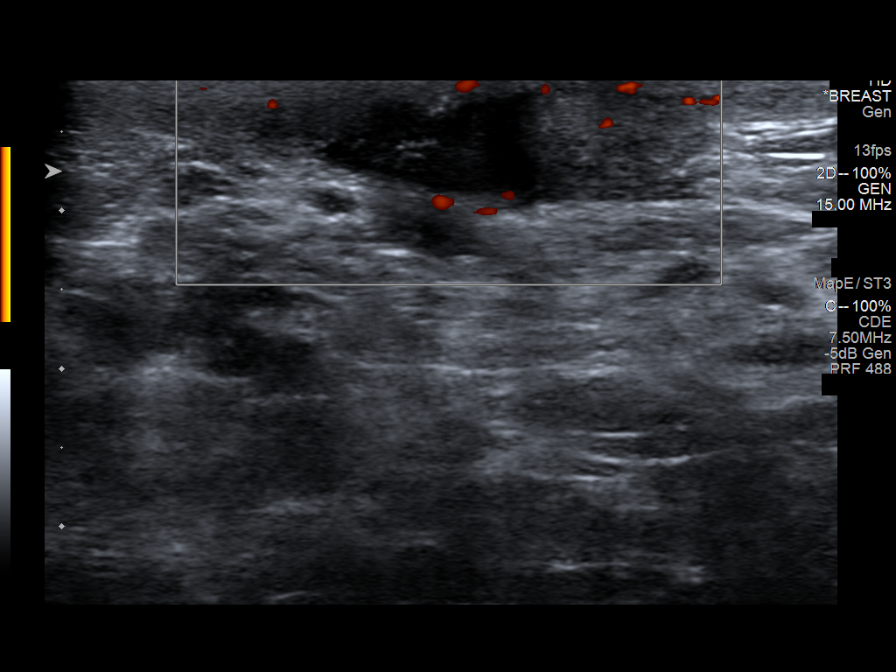
[im 5/7]
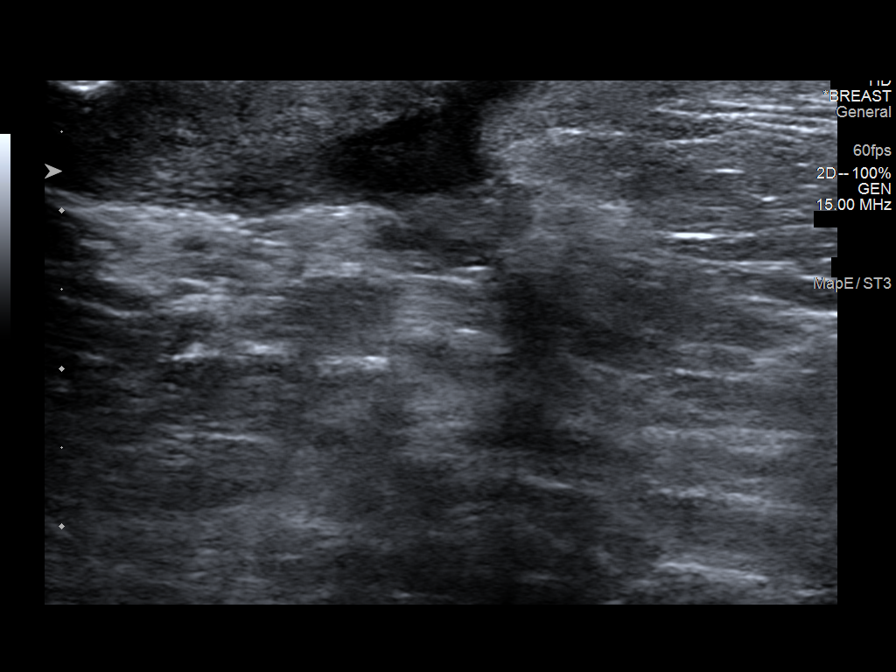
[im 6/7]
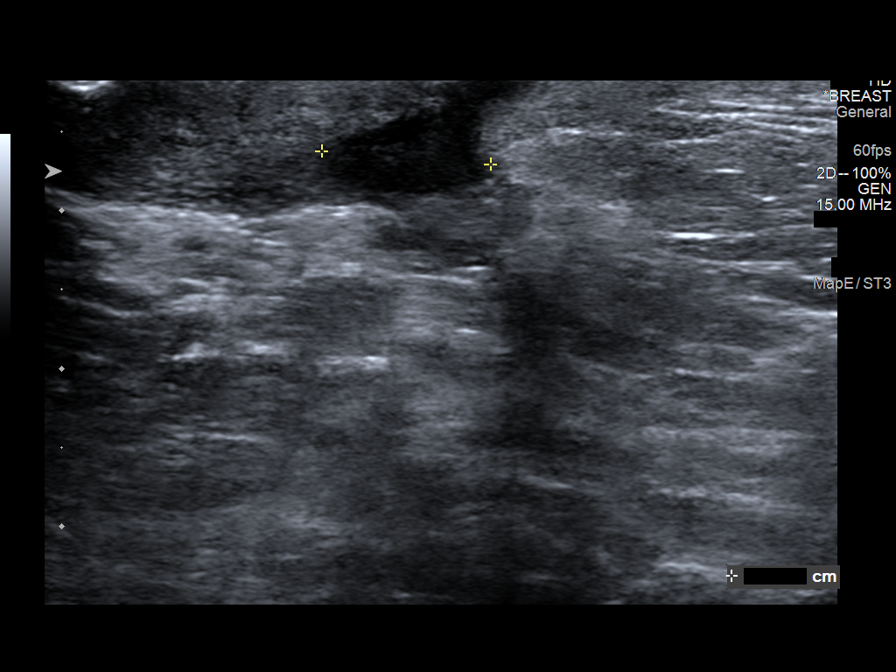
[im 7/7]
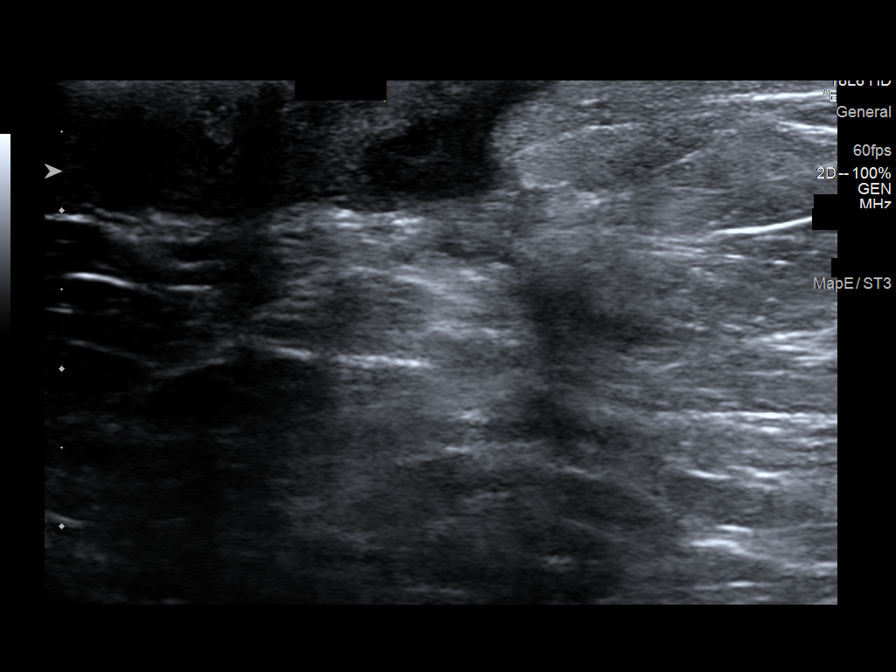

[7 of 7 positions shown; findings below may reference images not displayed]

FINDINGS: On physical exam, there is mild erythema and induration about the
areola. There is a small opening in the skin adjacent to the areola
in the lower outer left breast with the patient states there is
intermittent drainage. She denies any fevers. She states the breast
feels about the same as last time she was here.

Targeted ultrasound is performed in the left breast at 4
o'clock/retroareolar demonstrating an irregular complex hypoechoic
fluid collection that is primarily within the thickened portion of
the areola. The collection measures approximately 1.7 x 0.7 x
cm, previously measuring 1.8 x 1.6 x 0.7 cm. There is skin
thickening measuring up to 0.9 cm. No new deeper collection
identified.
IMPRESSION: Persistent complex fluid collection primarily within the skin of the
left areola measuring up to 1.7 cm, not significantly changed
compared to the prior ultrasound. Aspiration was previously
attempted but no fluid could be obtained. Given that the collection
has not significantly changed, aspiration is not recommended at this
time.

Patient has only completed 5 days of the recommended two week course
of antibiotics as of today.

RECOMMENDATION:
1.  Complete recommended course of antibiotics (Kelfex).

2. Return for follow-up ultrasound approximately 3-5 days following
course of antibiotics. Patient was instructed to return sooner
should she develop worsening symptoms or fevers.

I have discussed the findings and recommendations with the patient.
If applicable, a reminder letter will be sent to the patient
regarding the next appointment.

BI-RADS CATEGORY  2: Benign.

## 2021-06-04 ENCOUNTER — Other Ambulatory Visit: Payer: Self-pay

## 2021-06-04 ENCOUNTER — Ambulatory Visit: Payer: Self-pay | Admitting: *Deleted

## 2021-06-04 ENCOUNTER — Ambulatory Visit: Payer: Medicaid Other

## 2021-06-04 VITALS — BP 98/72 | Wt 168.5 lb

## 2021-06-04 DIAGNOSIS — Z1239 Encounter for other screening for malignant neoplasm of breast: Secondary | ICD-10-CM

## 2021-06-04 DIAGNOSIS — R2232 Localized swelling, mass and lump, left upper limb: Secondary | ICD-10-CM

## 2021-06-04 DIAGNOSIS — N6320 Unspecified lump in the left breast, unspecified quadrant: Secondary | ICD-10-CM

## 2021-06-04 NOTE — Patient Instructions (Signed)
Explained breast self awareness with [name]. Patient refused Pap smear today. Explained the importance of Pap smears. Let her know [facility] will cover Pap smears every 3 years unless has a history of abnormal Pap smears. Referred patient to the [facility] for a diagnostic mammogram. Appointment scheduled Saturday, June 08, 2021 at 0850. Patient referred back to [facility] for follow-up. Patient stated she will call and schedule appointment. Patient aware of appointment and will be there. Chinara Hertzberg verbalized understanding.  Donabelle Molden, Kathaleen Maser, RN 1:54 PM

## 2021-06-04 NOTE — Progress Notes (Signed)
Erin Gonzales is a 35 y.o. female who presents to [facility] clinic today with complaint of left breast abscess that will not heal. Patient stated she had surgery at [facility] in 2021 and the wound has not healed within her left breast. Patient states it drains a mucous looking discharge..    Pap Smear: Pap smear not completed today. Last Pap smear was in 2019 and was normal per patient. Per patient has no history of an abnormal Pap smear. Last Pap smear result is not available in Epic.   Physical exam: Breasts Breasts symmetrical. Scars observed bilateral lower breasts from history of breast reduction. Pea sized healing draining wound observed left breast at 3 o'clock next to nipple. Drainage had a milky appearance. No nipple retraction bilateral breasts. No nipple discharge bilateral breasts. No lymphadenopathy. No lumps palpated right breast. Palpated a pea sized lump within the left axilla at 2 o'clock 13 cm from the nipple. No complaints of pain or tenderness on exam.  MM DIAG BREAST TOMO UNI LEFT  Addendum Date: 06/07/2020   ADDENDUM REPORT: 06/07/2020 11:57 ADDENDUM: Surgical consultation has been arranged with Dr. Carman Ching at [facility] on June 08, 2020. Collene Mares RN on 06/07/2020 Electronically Signed   By: Emmaline Kluver M.D.   On: 06/07/2020 11:57   Result Date: 06/07/2020 CLINICAL DATA:  35 year old female with history of left breast lumpectomy in 2017 followed by a bilateral breast reduction in 2019. Patient has a nonhealing draining wound in the left breast. EXAM: DIGITAL DIAGNOSTIC LEFT MAMMOGRAM WITH TOMO ULTRASOUND LEFT BREAST COMPARISON:  Previous exam(s). ACR Breast Density Category b: There are scattered areas of fibroglandular density. FINDINGS: Mammogram: Full field and spot compression tomosynthesis views of the left breast were performed. A possible asymmetry in the outer left breast posterior depth resolves on spot compression imaging. There  are post reduction changes in the retroareolar aspect of the breast. The previously seen mild ill-defined density in the retroareolar left breast is slightly decreased. There are no new suspicious findings in the left breast. On physical exam, there is a small opening to the skin along the lower outer areola. There is no fixed discrete mass or erythema. Ultrasound: Targeted ultrasound is performed in the left breast at 4 o'clock/retroareolar demonstrating a small amount of fluid and phlegmonous material within the thickened portion of the areola, which connects to the skin. Overall the fluid/phlegmonous component measures 1.0 x 0.9 x 0.8 cm. There is no suspicious mass. No internal vascularity. IMPRESSION: In the left breast at the site of an opening to the skin there is a small amount of fluid/phlegmonous material within the skin layer, consistent with chronic small abscess. Appearance is similar to the prior study. There is no deeper collection within the breast tissue. No signs of active infection. RECOMMENDATION: 1.  Surgical consultation for non healing wound in the left breast. 2.  Imaging follow-up of the left breast as needed. 3.  Begin routine annual screening mammography at age 54. I have discussed the findings and recommendations with the patient. If applicable, a reminder letter will be sent to the patient regarding the next appointment. BI-RADS CATEGORY  2: Benign. Electronically Signed: By: Emmaline Kluver M.D. On: 06/05/2020 16:42   MS DIGITAL DIAG TOMO BILAT  Result Date: 01/17/2020 CLINICAL DATA:  Persistent thickening in the retroareolar left breast following antibiotic treatment for a breast abscess, aspirated by her primary care physician. She also has a persistent left breast open wound draining pus  that began 1 year following a bilateral breast reduction performed in 2019. EXAM: DIGITAL DIAGNOSTIC BILATERAL MAMMOGRAM WITH CAD AND TOMO ULTRASOUND LEFT BREAST COMPARISON:  Previous left  breast ultrasound and biopsy examinations. ACR Breast Density Category b: There are scattered areas of fibroglandular density. FINDINGS: Bilateral post reduction changes. Mild ill-defined increased density in the retroareolar left breast, most pronounced in the upper outer quadrant. Mammographic images were processed with CAD. On physical exam, there is mild dark skin discoloration across the upper left breast and extending inferiorly along the lateral portion of the breast. The patient reports that this is chronic since October 2020. There is no increased skin warmth. Is diffuse soft tissue thickening in the superior and lateral periareolar regions of the left breast. There is a small amount of pus extruding from a small opening in the 4 o'clock position of the areolar edge. Targeted ultrasound is performed, showing mild ill-defined increased echogenicity in the 12 o'clock position of the left breast, 3 cm from the nipple, with overlying skin thickening. There is also heterogeneous material within the posterior dermal layer and extending into the subcutaneous tissues in that area with no internal blood flow with power Doppler and no increased blood flow in the surrounding tissues with power Doppler. In the 4 o'clock position of the left breast, 1 cm from the nipple, there is an irregular hypoechoic fluid collection measuring 1.8 x 1.6 x 0.7 cm. This is primarily within a thickened portion of the areola with a small amount of extension beneath the areola and a linear extension to the skin surface, corresponding to the small opening extruding pus. This fluid collection has diffuse internal echoes and is not compressible. IMPRESSION: 1. 1.8 x 1.6 x 0.7 cm irregular fluid collection in the 4 o'clock position of the left areola with a small extension into the underlying soft tissues and a linear tract extending to the skin surface, extruding a small amount of pus. This is compatible with a small chronic abscess. 2.  Postoperative scarring in the 12 o'clock position of the left breast with no mass or drainable fluid collection. 3. No evidence of malignancy in either breast. RECOMMENDATION: 1. Ultrasound-guided aspiration of the 1.8 cm probable chronic abscess in the 11 o'clock areolar region of the left breast. 2. The patient will be given a prescription for 14 days of Keflex. 3. Follow-up left breast ultrasound in 2 weeks. I have discussed the findings and recommendations with the patient. If applicable, a reminder letter will be sent to the patient regarding the next appointment. BI-RADS CATEGORY  2: Benign. Electronically Signed   By: Beckie Salts M.D.   On: 01/17/2020 16:26        Pelvic/Bimanual Patient refused Pap smear today. Explained the importance of Pap smears.    Smoking History: Patient is a former smoker that quit 6 months ago.   Patient Navigation: Patient education provided. Access to services provided for patient through [facility]CP program.   Breast and Cervical Cancer Risk Assessment: Patient does not have family history of breast cancer, known genetic mutations, or radiation treatment to the chest before age 34. Patient does not have history of cervical dysplasia, immunocompromised, or DES exposure in-utero. Breast cancer risk assessment completed. No breast cancer risk calculated due to patient is less than 82 years old.  Risk Assessment     Risk Scores       06/04/2021 06/05/2020   Last edited by: Meryl Dare, CMA Sheela Stack R   5-year risk:  Lifetime risk:             A: B[facility]CP exam without pap smear Complaint of left breast surgical wound from abscess that has not healed.  P: Referred patient to the [facility] for a diagnostic mammogram. Appointment scheduled Saturday, June 08, 2021 at 0850.  Priscille Heidelberg, RN 06/04/2021 1:54 PM

## 2021-06-07 ENCOUNTER — Other Ambulatory Visit: Payer: Self-pay | Admitting: Obstetrics and Gynecology

## 2021-06-07 DIAGNOSIS — N6320 Unspecified lump in the left breast, unspecified quadrant: Secondary | ICD-10-CM

## 2021-06-08 ENCOUNTER — Inpatient Hospital Stay: Admission: RE | Admit: 2021-06-08 | Payer: Medicaid Other | Source: Ambulatory Visit

## 2021-06-08 ENCOUNTER — Other Ambulatory Visit: Payer: Medicaid Other

## 2022-04-08 ENCOUNTER — Encounter: Payer: Medicaid Other | Admitting: Obstetrics and Gynecology

## 2022-04-08 NOTE — Progress Notes (Deleted)
ANNUAL EXAM Patient name: Erin Gonzales MRN [id number]  Date of birth: August 29, 1986 Chief Complaint:   No chief complaint on file.  History of Present Illness:   Erin Gonzales is a 36 y.o. G43P0010 female being seen today for a routine annual exam.  Current complaints: ***  No LMP recorded.   The pregnancy intention screening data noted above was reviewed. Potential methods of contraception were discussed. The patient elected to proceed with No data recorded.   Last pap 2014. Results were: NILM w/ HRHPV not done. H/O abnormal pap: no       04/30/2017   10:43 AM 07/18/2016    9:35 AM 07/14/2016    4:28 PM 02/23/2016    9:27 AM 08/13/2015    8:49 AM  Depression screen PHQ 2/9  Decreased Interest 1 0 0 0 0  Down, Depressed, Hopeless 1 0 0 0 0  PHQ - 2 Score 2 0 0 0 0  Altered sleeping 1      Tired, decreased energy 1      Change in appetite 1      Feeling bad or failure about yourself  0      Trouble concentrating 1      Moving slowly or fidgety/restless 0      Suicidal thoughts 0      PHQ-9 Score 6            04/30/2017   10:43 AM  GAD 7 : Generalized Anxiety Score  Nervous, Anxious, on Edge 1  Control/stop worrying 0  Worry too much - different things 0  Trouble relaxing 0  Restless 0  Easily annoyed or irritable 0  Afraid - awful might happen 0  Total GAD 7 Score 1     Review of Systems:   Pertinent items are noted in HPI Denies any headaches, blurred vision, fatigue, shortness of breath, chest pain, abdominal pain, abnormal vaginal discharge/itching/odor/irritation, problems with periods, bowel movements, urination, or intercourse unless otherwise stated above. *** Pertinent History Reviewed:  Reviewed past medical,surgical, social and family history.  Reviewed problem list, medications and allergies. Physical Assessment:  There were no vitals filed for this visit.There is no height or weight on file to calculate BMI.   Physical Examination:  General  appearance - well appearing, and in no distress Mental status - alert, oriented to person, place, and time Psych:  She has a normal mood and affect Skin - warm and dry, normal color, no suspicious lesions noted Chest - effort normal, all lung fields clear to auscultation bilaterally Heart - normal rate and regular rhythm Neck:  midline trachea, no thyromegaly or nodules Breasts - breasts appear normal, no suspicious masses, no skin or nipple changes or  axillary nodes Abdomen - soft, nontender, nondistended, no masses or organomegaly Pelvic -  VULVA: normal appearing vulva with no masses, tenderness or lesions   VAGINA: normal appearing vagina with normal color and discharge, no lesions   CERVIX: normal appearing cervix without discharge or lesions, no CMT UTERUS: uterus is felt to be normal size, shape, consistency and nontender  ADNEXA: No adnexal masses or tenderness noted. Extremities:  No swelling or varicosities noted  Chaperone present for exam  No results found for this or any previous visit (from the past 24 hour(s)).  Assessment & Plan:  Diagnoses and all orders for this visit:  Encounter for annual routine gynecological examination  - Cervical cancer screening: Discussed guidelines. Pap with HPV done - Gardasil: {Blank single:19197::"***","has not yet  had. Will provide information","completed","has not yet had. Counseling provided and she declines","Has not yet had. Counseling provided and pt accepts"} - GC/CT: {Blank single:19197::"accepts","declines","not indicated"} - Birth Control: Discussed options and their risks, benefits, and common side effects; discussed VTE with estrogen containing options - desires: {Birth control type:23956} - Breast Health: Encouraged self breast awareness/SBE. Teaching provided.  - F/U 12 months and prn     No orders of the defined types were placed in this encounter.   Meds: No orders of the defined types were placed in this  encounter.   Follow-up: No follow-ups on file.  Milas Hock, MD 04/08/2022 6:19 PM

## 2022-04-10 ENCOUNTER — Encounter: Payer: Medicaid Other | Admitting: Obstetrics and Gynecology

## 2022-04-10 DIAGNOSIS — Z01419 Encounter for gynecological examination (general) (routine) without abnormal findings: Secondary | ICD-10-CM
